# Patient Record
Sex: Female | Born: 1980 | Race: Black or African American | Hispanic: No | Marital: Single | Smoking: Current every day smoker
Health system: Southern US, Community
[De-identification: ages and names within clinical notes are randomized; demographics above are authoritative.]

## PROBLEM LIST (undated history)

## (undated) DIAGNOSIS — J209 Acute bronchitis, unspecified: Secondary | ICD-10-CM

## (undated) DIAGNOSIS — M549 Dorsalgia, unspecified: Secondary | ICD-10-CM

## (undated) HISTORY — DX: Acute bronchitis, unspecified: J20.9

## (undated) HISTORY — PX: TUBAL LIGATION: SHX77

---

## 2018-10-25 DIAGNOSIS — Z0389 Encounter for observation for other suspected diseases and conditions ruled out: Secondary | ICD-10-CM | POA: Diagnosis not present

## 2018-10-25 DIAGNOSIS — Z3009 Encounter for other general counseling and advice on contraception: Secondary | ICD-10-CM | POA: Diagnosis not present

## 2018-10-25 DIAGNOSIS — Z1388 Encounter for screening for disorder due to exposure to contaminants: Secondary | ICD-10-CM | POA: Diagnosis not present

## 2019-05-17 ENCOUNTER — Other Ambulatory Visit: Payer: Self-pay

## 2019-05-17 ENCOUNTER — Encounter (HOSPITAL_COMMUNITY): Payer: Self-pay

## 2019-05-17 ENCOUNTER — Emergency Department (HOSPITAL_COMMUNITY)
Admission: EM | Admit: 2019-05-17 | Discharge: 2019-05-17 | Disposition: A | Discharge status: Home / Self Care | Payer: Medicaid Other | Attending: Emergency Medicine | Admitting: Emergency Medicine

## 2019-05-17 ENCOUNTER — Emergency Department (HOSPITAL_COMMUNITY): Payer: Medicaid Other

## 2019-05-17 DIAGNOSIS — F17210 Nicotine dependence, cigarettes, uncomplicated: Secondary | ICD-10-CM | POA: Insufficient documentation

## 2019-05-17 DIAGNOSIS — R0789 Other chest pain: Secondary | ICD-10-CM | POA: Diagnosis not present

## 2019-05-17 DIAGNOSIS — Z79899 Other long term (current) drug therapy: Secondary | ICD-10-CM | POA: Diagnosis not present

## 2019-05-17 DIAGNOSIS — F439 Reaction to severe stress, unspecified: Secondary | ICD-10-CM

## 2019-05-17 DIAGNOSIS — R519 Headache, unspecified: Secondary | ICD-10-CM | POA: Diagnosis not present

## 2019-05-17 DIAGNOSIS — R079 Chest pain, unspecified: Secondary | ICD-10-CM

## 2019-05-17 HISTORY — DX: Dorsalgia, unspecified: M54.9

## 2019-05-17 LAB — TROPONIN I (HIGH SENSITIVITY)
Troponin I (High Sensitivity): 3 ng/L (ref <18)
Troponin I (High Sensitivity): 4 ng/L (ref <18)

## 2019-05-17 LAB — CBC
HCT: 43.7 % (ref 36.0–46.0)
Hemoglobin: 14 g/dL (ref 12.0–15.0)
MCH: 30.1 pg (ref 26.0–34.0)
MCHC: 32 g/dL (ref 30.0–36.0)
MCV: 94 fL (ref 80.0–100.0)
Platelets: 401 K/uL — ABNORMAL HIGH (ref 150–400)
RBC: 4.65 MIL/uL (ref 3.87–5.11)
RDW: 13.7 % (ref 11.5–15.5)
WBC: 6.4 K/uL (ref 4.0–10.5)
nRBC: 0 % (ref 0.0–0.2)

## 2019-05-17 LAB — BASIC METABOLIC PANEL WITH GFR
Anion gap: 9 (ref 5–15)
BUN: 11 mg/dL (ref 6–20)
CO2: 22 mmol/L (ref 22–32)
Calcium: 9 mg/dL (ref 8.9–10.3)
Chloride: 105 mmol/L (ref 98–111)
Creatinine, Ser: 0.72 mg/dL (ref 0.44–1.00)
GFR calc Af Amer: >60 mL/min
GFR calc non Af Amer: >60 mL/min
Glucose, Bld: 94 mg/dL (ref 70–99)
Potassium: 3.6 mmol/L (ref 3.5–5.1)
Sodium: 136 mmol/L (ref 135–145)

## 2019-05-17 LAB — I-STAT BETA HCG BLOOD, ED (MC, WL, AP ONLY): I-stat hCG, quantitative: <5 m[IU]/mL

## 2019-05-17 LAB — HIV ANTIBODY (ROUTINE TESTING W REFLEX): HIV Screen 4th Generation wRfx: NONREACTIVE

## 2019-05-17 MED ORDER — ALPRAZOLAM 0.5 MG PO TABS: 0.5000 mg | ORAL_TABLET | Freq: Two times a day (BID) | ORAL | 0 refills | Status: DC | PRN

## 2019-05-17 MED ORDER — ALPRAZOLAM 0.5 MG PO TABS
0.5000 mg | ORAL_TABLET | Freq: Once | ORAL | Status: AC
  Administered 2019-05-17: 0.5 mg via ORAL
  Filled 2019-05-17: qty 1

## 2019-05-17 MED ORDER — SODIUM CHLORIDE 0.9% FLUSH: 3.0000 mL | Freq: Once | INTRAVENOUS | Status: DC
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
## 2019-05-17 NOTE — Discharge Instructions (Addendum)
Small prescription for anxiety medication.  Try to follow-up with [facility].  Phone number given.
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
## 2019-05-23 NOTE — Progress Notes (Signed)
Patient ID: Christina Rosales, female   DOB: 1980-05-19, 39 y.o.   MRN: [id number] Virtual Visit via Telephone Note  I connected with Barron Alvine on 05/24/19 at  3:30 PM EST by telephone and verified that I am speaking with the correct person using two identifiers.   I discussed the limitations, risks, security and privacy concerns of performing an evaluation and management service by telephone and the availability of in person appointments. I also discussed with the patient that there may be a patient responsible charge related to this service. The patient expressed understanding and agreed to proceed.  PATIENT visit by telephone virtually in the context of Covid-19 pandemic. Patient location:  home My Location:  [facility] office Persons on the call:  Me and the patient  History of Present Illness: After being seen in the ED 05/17/2019 for chest pain.  Work up unremarkable.  Prescribed xanax for anxiety.  Labs unremarkable including-BMP, CBC, HIV, cardiac enzymes, neg pregnancy.  Patient is feeling better and has not had anymore chest pain.  She does feel like she has problems with over thinking and worrying too much.  She took a medication about 10 years ago but is unable to remember the name of it.  She denies SI/HI.  Started a new job yesterday.  Depression screen PHQ 2/9 05/24/2019  Decreased Interest 1  Down, Depressed, Hopeless 1  PHQ - 2 Score 2  Altered sleeping 2  Tired, decreased energy 3  Change in appetite 1  Feeling bad or failure about yourself  1  Trouble concentrating 0  Moving slowly or fidgety/restless 0  Suicidal thoughts 0  PHQ-9 Score 9      Observations/Objective:  NAD.  A&Ox3   Assessment and Plan: 1. Anxiety Try zoloft 50mg  1/2 daily for 1 week then one daily - Ambulatory referral to Social Work  2. Chest pain, unspecified type resolved  3. Encounter for examination following treatment at hospital   Follow Up Instructions: Assign PCP IN 1 MONTH   I  discussed the assessment and treatment plan with the patient. The patient was provided an opportunity to ask questions and all were answered. The patient agreed with the plan and demonstrated an understanding of the instructions.   The patient was advised to call back or seek an in-person evaluation if the symptoms worsen or if the condition fails to improve as anticipated.  I provided 15 minutes of non-face-to-face time during this encounter.   , PA-C

## 2019-05-24 ENCOUNTER — Other Ambulatory Visit: Payer: Self-pay

## 2019-05-24 ENCOUNTER — Ambulatory Visit: Payer: Medicaid Other | Attending: Family Medicine | Admitting: Physician Assistant

## 2019-05-24 DIAGNOSIS — F419 Anxiety disorder, unspecified: Secondary | ICD-10-CM | POA: Diagnosis not present

## 2019-05-24 DIAGNOSIS — R079 Chest pain, unspecified: Secondary | ICD-10-CM

## 2019-05-24 DIAGNOSIS — Z09 Encounter for follow-up examination after completed treatment for conditions other than malignant neoplasm: Secondary | ICD-10-CM | POA: Diagnosis not present

## 2019-05-24 MED ORDER — SERTRALINE HCL 50 MG PO TABS: 50.0000 mg | ORAL_TABLET | Freq: Every day | ORAL | 3 refills | Status: DC

## 2019-05-24 NOTE — Progress Notes (Signed)
Hospital f /u Feeling lightheaded   Would like resources to smoking cessation

## 2019-05-29 ENCOUNTER — Telehealth: Payer: Self-pay | Admitting: Licensed Clinical Social Worker

## 2019-05-29 NOTE — Telephone Encounter (Signed)
Call placed to patient regarding [facility] referral. LCSW left message requesting a return call.  

## 2019-06-05 ENCOUNTER — Telehealth: Payer: Self-pay | Admitting: Licensed Clinical Social Worker

## 2019-06-05 NOTE — Telephone Encounter (Signed)
Call placed to patient regarding [facility] referral. LCSW left message requesting a return call.  

## 2019-06-07 DIAGNOSIS — M7751 Other enthesopathy of right foot: Secondary | ICD-10-CM | POA: Diagnosis not present

## 2019-06-07 DIAGNOSIS — M7752 Other enthesopathy of left foot: Secondary | ICD-10-CM | POA: Diagnosis not present

## 2019-06-07 DIAGNOSIS — M722 Plantar fascial fibromatosis: Secondary | ICD-10-CM | POA: Diagnosis not present

## 2019-06-11 DIAGNOSIS — H5213 Myopia, bilateral: Secondary | ICD-10-CM | POA: Diagnosis not present

## 2019-06-28 DIAGNOSIS — M722 Plantar fascial fibromatosis: Secondary | ICD-10-CM | POA: Diagnosis not present

## 2019-07-02 ENCOUNTER — Encounter (HOSPITAL_COMMUNITY): Payer: Self-pay

## 2019-07-02 ENCOUNTER — Ambulatory Visit (HOSPITAL_COMMUNITY)
Admission: EM | Admit: 2019-07-02 | Discharge: 2019-07-02 | Disposition: A | Discharge status: Home / Self Care | Payer: Medicaid Other | Attending: Family Medicine | Admitting: Family Medicine

## 2019-07-02 ENCOUNTER — Other Ambulatory Visit: Payer: Self-pay

## 2019-07-02 DIAGNOSIS — J029 Acute pharyngitis, unspecified: Secondary | ICD-10-CM | POA: Diagnosis not present

## 2019-07-02 LAB — POCT RAPID STREP A: Streptococcus, Group A Screen (Direct): NEGATIVE

## 2019-07-02 MED ORDER — FLUTICASONE PROPIONATE 50 MCG/ACT NA SUSP: 2.0000 | Freq: Every day | NASAL | 2 refills | Status: DC

## 2019-07-02 MED ORDER — AMOXICILLIN 875 MG PO TABS: 875.0000 mg | ORAL_TABLET | Freq: Two times a day (BID) | ORAL | 0 refills | Status: DC

## 2019-07-02 NOTE — Discharge Instructions (Signed)
Drink plenty of fluids Take amoxicillin 2 times a day for a week Use the Flonase until your symptoms improve  consider taking medicine for stomach acid like omeprazole, elevate the head of your bed if you think acid reflux could be contributing See your family doctor in follow-up

## 2019-07-02 NOTE — ED Triage Notes (Signed)
Patient complaining of sore throat for about two weeks now, but has gotten much worse over the last two days. Denies fever, cough, body aches, headache, nasal congestion, and is tested twice weekly through her employer. Most recent covid test was today and was negative.

## 2019-07-02 NOTE — ED Provider Notes (Signed)
[place]    CSN: [id number] Arrival date & time: 07/02/19  1900      History   Chief Complaint Chief Complaint  Patient presents with  . Sore Throat    HPI Christina Rosales is a 39 y.o. female.   HPI  Patient has had a sore throat for 2 weeks.  Is been worse the last 2 days.  No fever chills.  No body aches.  No runny or stuffy nose or sinus pain although she does have chronic postnasal drip.  She denies any GI distress or acid reflux.  She states she very rarely gets heartburn.  She does have some allergy symptoms.  They are never severe.  She is not taking any medications.  She has tried hot teas, sore throat sprays, salt water gargles.  Her throat is still painful.  No known exposure to illness.  She gets Covid tested twice a week.  She works in a nursing home.  She does smoke cigarettes and is advised to quit  Past Medical History:  Diagnosis Date  . Back pain     Patient Active Problem List   Diagnosis Date Noted  . Anxiety 05/24/2019    Past Surgical History:  Procedure Laterality Date  . TUBAL LIGATION      OB History   No obstetric history on file.      Home Medications    Prior to Admission medications   Medication Sig Start Date End Date Taking? Authorizing Provider  ALPRAZolam Duanne Moron) 0.5 MG tablet Take 1 tablet (0.5 mg total) by mouth 2 (two) times daily as needed for anxiety. 05/17/19   Nat Christen, MD  amoxicillin (AMOXIL) 875 MG tablet Take 1 tablet (875 mg total) by mouth 2 (two) times daily. 07/02/19   Raylene Everts, MD  Aspirin-Salicylamide-Caffeine [facility] HEADACHE PO) Take 1 packet by mouth daily as needed (pain).    [provider]  fluticasone (FLONASE) 50 MCG/ACT nasal spray Place 2 sprays into both nostrils daily. 07/02/19   Raylene Everts, MD  Multiple Vitamin (MULTIVITAMIN ADULT) TABS Take 1 tablet by mouth daily.    [provider]  naproxen sodium (ALEVE) 220 MG tablet Take 220 mg by mouth daily as needed  (pain).    [provider]  sertraline (ZOLOFT) 50 MG tablet Take 1 tablet (50 mg total) by mouth daily. 05/24/19   Argentina Donovan, PA-C    Family History No family history on file.  Social History Social History   Tobacco Use  . Smoking status: Current Some Day Smoker    Packs/day: 0.25  . Smokeless tobacco: Never Used  Substance Use Topics  . Alcohol use: Yes    Comment: socially   . Drug use: Yes    Types: Marijuana     Allergies   Kiwi extract and Percocet [oxycodone-acetaminophen]   Review of Systems Review of Systems  Constitutional: Negative for chills and fever.  HENT: Positive for postnasal drip and sore throat. Negative for congestion.   Gastrointestinal: Negative for abdominal pain.  Neurological: Negative for headaches.     Physical Exam Triage Vital Signs ED Triage Vitals  Enc Vitals Group     BP 07/02/19 1917 112/64     Pulse Rate 07/02/19 1917 72     Resp 07/02/19 1917 14     Temp 07/02/19 1917 98.5 F (36.9 C)     Temp Source 07/02/19 1917 Oral     SpO2 07/02/19 1917 97 %  Weight 07/02/19 1919 195 lb (88.5 kg)     Height --      Head Circumference --      Peak Flow --      Pain Score 07/02/19 1919 6     Pain Loc --      Pain Edu? --      Excl. in GC? --    No data found.  Updated Vital Signs BP 112/64 (BP Location: Left Arm)   Pulse 72   Temp 98.5 F (36.9 C) (Oral)   Resp 14   Wt 88.5 kg   SpO2 97%      Physical Exam Constitutional:      General: She is not in acute distress.    Appearance: She is well-developed.  HENT:     Head: Normocephalic and atraumatic.     Right Ear: Tympanic membrane and ear canal normal.     Left Ear: Tympanic membrane and ear canal normal.     Nose: No congestion.     Mouth/Throat:     Mouth: Mucous membranes are moist.     Pharynx: Posterior oropharyngeal erythema present.     Tonsils: No tonsillar exudate or tonsillar abscesses. 2+ on the right. 2+ on the left.  Eyes:      Conjunctiva/sclera: Conjunctivae normal.     Pupils: Pupils are equal, round, and reactive to light.  Cardiovascular:     Rate and Rhythm: Normal rate and regular rhythm.  Pulmonary:     Effort: Pulmonary effort is normal. No respiratory distress.     Breath sounds: Normal breath sounds.  Musculoskeletal:        General: Normal range of motion.     Cervical back: Normal range of motion.  Lymphadenopathy:     Cervical: No cervical adenopathy.  Skin:    General: Skin is warm and dry.  Neurological:     Mental Status: She is alert.  Psychiatric:        Mood and Affect: Mood normal.        Behavior: Behavior normal.      UC Treatments / Results  Labs (all labs ordered are listed, but only abnormal results are displayed) Labs Reviewed  CULTURE, GROUP A STREP [facility])  POCT RAPID STREP A    EKG   Radiology No results found.  Procedures Procedures (including critical care time)  Medications Ordered in UC Medications - No data to display  Initial Impression / Assessment and Plan / UC Course  I have reviewed the triage vital signs and the nursing notes.  Pertinent labs & imaging results that were available during my care of the patient were reviewed by me and considered in my medical decision making (see chart for details).      The rapid strep test is negative.  Culture is pending.  We discussed that her chronic sore throat can be caused by exogenous chemicals and fumes such as her cigarette smoking, postnasal drip and sinus problems or allergies, it can be a primary throat infection or tonsillitis although it is unusual to last 2 weeks, it can be from acid reflux especially at nighttime.  Going to start with treatment of sinusitis with Flonase and amoxicillin since she states she does have some postnasal drip.  She will pay attention to her GI symptoms and consider omeprazole if she fails to improve.  Follow-up with her PCP.  Consider ENT consultation if fails to  improve Final Clinical Impressions(s) / UC Diagnoses   Final diagnoses:  Pharyngitis, unspecified etiology     Discharge Instructions     Drink plenty of fluids Take amoxicillin 2 times a day for a week Use the Flonase until your symptoms improve  consider taking medicine for stomach acid like omeprazole, elevate the head of your bed if you think acid reflux could be contributing See your family doctor in follow-up    ED Prescriptions    Medication Sig Dispense Auth. Provider   fluticasone (FLONASE) 50 MCG/ACT nasal spray Place 2 sprays into both nostrils daily. 16 g Eustace Moore, MD   amoxicillin (AMOXIL) 875 MG tablet Take 1 tablet (875 mg total) by mouth 2 (two) times daily. 14 tablet Eustace Moore, MD     PDMP not reviewed this encounter.   Eustace Moore, MD 07/02/19 2008

## 2019-07-03 ENCOUNTER — Telehealth (HOSPITAL_COMMUNITY): Payer: Self-pay

## 2019-07-03 MED ORDER — FLUCONAZOLE 150 MG PO TABS: 150.0000 mg | ORAL_TABLET | Freq: Once | ORAL | 0 refills | Status: AC

## 2019-07-03 NOTE — Telephone Encounter (Signed)
Pt phoned and requested a Rx of Diflucan while she takes Amoxicillin that was prescribed yesterday. Rx of Diflucan approved by Patterson Hammersmith, PA and sent to pt's pharmacy requested, [facility] on [address].

## 2019-07-05 LAB — CULTURE, GROUP A STREP (THRC)

## 2019-10-19 ENCOUNTER — Other Ambulatory Visit: Payer: Self-pay | Admitting: Physician Assistant

## 2020-01-03 DIAGNOSIS — Z6838 Body mass index (BMI) 38.0-38.9, adult: Secondary | ICD-10-CM | POA: Diagnosis not present

## 2020-01-03 DIAGNOSIS — E669 Obesity, unspecified: Secondary | ICD-10-CM | POA: Diagnosis not present

## 2020-01-03 DIAGNOSIS — Z23 Encounter for immunization: Secondary | ICD-10-CM | POA: Diagnosis not present

## 2020-01-03 DIAGNOSIS — Z01411 Encounter for gynecological examination (general) (routine) with abnormal findings: Secondary | ICD-10-CM | POA: Diagnosis not present

## 2020-01-03 DIAGNOSIS — Z131 Encounter for screening for diabetes mellitus: Secondary | ICD-10-CM | POA: Diagnosis not present

## 2020-01-03 DIAGNOSIS — Z113 Encounter for screening for infections with a predominantly sexual mode of transmission: Secondary | ICD-10-CM | POA: Diagnosis not present

## 2020-01-03 DIAGNOSIS — Z13220 Encounter for screening for lipoid disorders: Secondary | ICD-10-CM | POA: Diagnosis not present

## 2020-01-03 DIAGNOSIS — Z30011 Encounter for initial prescription of contraceptive pills: Secondary | ICD-10-CM | POA: Diagnosis not present

## 2020-01-03 DIAGNOSIS — Z3009 Encounter for other general counseling and advice on contraception: Secondary | ICD-10-CM | POA: Diagnosis not present

## 2020-01-03 DIAGNOSIS — Z124 Encounter for screening for malignant neoplasm of cervix: Secondary | ICD-10-CM | POA: Diagnosis not present

## 2020-01-03 DIAGNOSIS — Z114 Encounter for screening for human immunodeficiency virus [HIV]: Secondary | ICD-10-CM | POA: Diagnosis not present

## 2020-01-10 DIAGNOSIS — Z30014 Encounter for initial prescription of intrauterine contraceptive device: Secondary | ICD-10-CM | POA: Diagnosis not present

## 2020-01-10 DIAGNOSIS — Z30430 Encounter for insertion of intrauterine contraceptive device: Secondary | ICD-10-CM | POA: Diagnosis not present

## 2020-01-10 DIAGNOSIS — N920 Excessive and frequent menstruation with regular cycle: Secondary | ICD-10-CM | POA: Diagnosis not present

## 2020-01-11 DIAGNOSIS — Z23 Encounter for immunization: Secondary | ICD-10-CM | POA: Diagnosis not present

## 2020-01-29 DIAGNOSIS — L700 Acne vulgaris: Secondary | ICD-10-CM | POA: Diagnosis not present

## 2020-01-29 DIAGNOSIS — R635 Abnormal weight gain: Secondary | ICD-10-CM | POA: Diagnosis not present

## 2020-01-29 DIAGNOSIS — N898 Other specified noninflammatory disorders of vagina: Secondary | ICD-10-CM | POA: Diagnosis not present

## 2020-01-29 DIAGNOSIS — N760 Acute vaginitis: Secondary | ICD-10-CM | POA: Diagnosis not present

## 2020-01-29 DIAGNOSIS — B9689 Other specified bacterial agents as the cause of diseases classified elsewhere: Secondary | ICD-10-CM | POA: Diagnosis not present

## 2020-03-11 ENCOUNTER — Emergency Department (HOSPITAL_COMMUNITY): Payer: Medicaid Other

## 2020-03-11 ENCOUNTER — Emergency Department (HOSPITAL_COMMUNITY)
Admission: EM | Admit: 2020-03-11 | Discharge: 2020-03-11 | Disposition: A | Discharge status: Home / Self Care | Payer: Medicaid Other | Attending: Emergency Medicine | Admitting: Emergency Medicine

## 2020-03-11 ENCOUNTER — Other Ambulatory Visit: Payer: Self-pay

## 2020-03-11 ENCOUNTER — Encounter (HOSPITAL_COMMUNITY): Payer: Self-pay

## 2020-03-11 DIAGNOSIS — R110 Nausea: Secondary | ICD-10-CM | POA: Diagnosis not present

## 2020-03-11 DIAGNOSIS — F17200 Nicotine dependence, unspecified, uncomplicated: Secondary | ICD-10-CM | POA: Diagnosis not present

## 2020-03-11 DIAGNOSIS — R0789 Other chest pain: Secondary | ICD-10-CM | POA: Diagnosis not present

## 2020-03-11 DIAGNOSIS — R079 Chest pain, unspecified: Secondary | ICD-10-CM | POA: Diagnosis not present

## 2020-03-11 DIAGNOSIS — R0602 Shortness of breath: Secondary | ICD-10-CM | POA: Diagnosis not present

## 2020-03-11 LAB — TROPONIN I (HIGH SENSITIVITY)
Troponin I (High Sensitivity): 3 ng/L (ref <18)
Troponin I (High Sensitivity): 3 ng/L (ref <18)

## 2020-03-11 LAB — CBC
HCT: 36.9 % (ref 36.0–46.0)
Hemoglobin: 11.8 g/dL — ABNORMAL LOW (ref 12.0–15.0)
MCH: 29.9 pg (ref 26.0–34.0)
MCHC: 32 g/dL (ref 30.0–36.0)
MCV: 93.4 fL (ref 80.0–100.0)
Platelets: 351 10*3/uL (ref 150–400)
RBC: 3.95 MIL/uL (ref 3.87–5.11)
RDW: 13.6 % (ref 11.5–15.5)
WBC: 9.3 10*3/uL (ref 4.0–10.5)
nRBC: 0 % (ref 0.0–0.2)

## 2020-03-11 LAB — BASIC METABOLIC PANEL
Anion gap: 10 (ref 5–15)
BUN: 13 mg/dL (ref 6–20)
CO2: 23 mmol/L (ref 22–32)
Calcium: 8.7 mg/dL — ABNORMAL LOW (ref 8.9–10.3)
Chloride: 105 mmol/L (ref 98–111)
Creatinine, Ser: 0.61 mg/dL (ref 0.44–1.00)
GFR, Estimated: >60 mL/min (ref >60)
Glucose, Bld: 108 mg/dL — ABNORMAL HIGH (ref 70–99)
Potassium: 3.4 mmol/L — ABNORMAL LOW (ref 3.5–5.1)
Sodium: 138 mmol/L (ref 135–145)

## 2020-03-11 LAB — PREGNANCY, URINE: Preg Test, Ur: NEGATIVE

## 2020-03-11 MED ORDER — SUCRALFATE 1 G PO TABS: 1.0000 g | ORAL_TABLET | Freq: Four times a day (QID) | ORAL | 0 refills | Status: DC | PRN

## 2020-03-11 MED ORDER — OMEPRAZOLE 20 MG PO CPDR: 20.0000 mg | DELAYED_RELEASE_CAPSULE | Freq: Every day | ORAL | 1 refills | Status: DC

## 2020-03-11 MED ORDER — ALUM & MAG HYDROXIDE-SIMETH 200-200-20 MG/5ML PO SUSP
30.0000 mL | Freq: Once | ORAL | Status: AC
  Administered 2020-03-11: 30 mL via ORAL
  Filled 2020-03-11: qty 30

## 2020-03-11 NOTE — ED Provider Notes (Signed)
[facility]-EMERGENCY DEPT [facility] Emergency Department Provider Note MRN:  [id number]  Arrival date & time: 03/11/20     Chief Complaint   Chest Pain   History of Present Illness   Christina Rosales is a 39 y.o. year-old female with no pertinent past medical history presenting to the ED with chief complaint of chest pain.  Location: Central chest, left lateral neck/throat Duration: 1 week Onset: Gradual Timing: Constant Description: Sharp Severity: Mild to moderate Exacerbating/Alleviating Factors: None, seems random Associated Symptoms: Nausea, mild shortness of breath Pertinent Negatives: Denies fever, no cough, no diaphoresis, no vomiting, no abdominal pain, no leg pain or swelling   Review of Systems  A complete 10 system review of systems was obtained and all systems are negative except as noted in the HPI and PMH.   Patient's Health History    Past Medical History:  Diagnosis Date  . Back pain     Past Surgical History:  Procedure Laterality Date  . TUBAL LIGATION      No family history on file.  Social History   Socioeconomic History  . Marital status: Single    Spouse name: Not on file  . Number of children: Not on file  . Years of education: Not on file  . Highest education level: Not on file  Occupational History  . Not on file  Tobacco Use  . Smoking status: Current Some Day Smoker    Packs/day: 0.25  . Smokeless tobacco: Never Used  Substance and Sexual Activity  . Alcohol use: Yes    Comment: socially   . Drug use: Yes    Types: Marijuana  . Sexual activity: Not Currently  Other Topics Concern  . Not on file  Social History Narrative  . Not on file   Social Determinants of Health   Financial Resource Strain:   . Difficulty of Paying Living Expenses: Not on file  Food Insecurity:   . Worried About Programme researcher, broadcasting/film/video in the Last Year: Not on file  . Ran Out of Food in the Last Year: Not on file  Transportation Needs:   . Lack of  Transportation (Medical): Not on file  . Lack of Transportation (Non-Medical): Not on file  Physical Activity:   . Days of Exercise per Week: Not on file  . Minutes of Exercise per Session: Not on file  Stress:   . Feeling of Stress : Not on file  Social Connections:   . Frequency of Communication with Friends and Family: Not on file  . Frequency of Social Gatherings with Friends and Family: Not on file  . Attends Religious Services: Not on file  . Active Member of Clubs or Organizations: Not on file  . Attends Banker Meetings: Not on file  . Marital Status: Not on file  Intimate Partner Violence:   . Fear of Current or Ex-Partner: Not on file  . Emotionally Abused: Not on file  . Physically Abused: Not on file  . Sexually Abused: Not on file     Physical Exam   Vitals:   03/11/20 0800 03/11/20 0830  BP: 125/87 (!) 156/110  Pulse:  61  Resp: 16 16  Temp:  98.6 F (37 C)  SpO2:  100%    CONSTITUTIONAL: Well-appearing, NAD NEURO:  Alert and oriented x 3, no focal deficits EYES:  eyes equal and reactive ENT/NECK:  no LAD, no JVD CARDIO: Regular rate, well-perfused, normal S1 and S2 PULM:  CTAB no wheezing or rhonchi  GI/GU:  normal bowel sounds, non-distended, non-tender MSK/SPINE:  No gross deformities, no edema SKIN:  no rash, atraumatic PSYCH:  Appropriate speech and behavior  *Additional and/or pertinent findings included in MDM below  Diagnostic and Interventional Summary    EKG Interpretation  Date/Time:  Tuesday March 11 2020 06:46:24 EST Ventricular Rate:  68 PR Interval:    QRS Duration: 92 QT Interval:  394 QTC Calculation: 419 R Axis:   58 Text Interpretation: Sinus rhythm 12 Lead; Mason-L[place]ar Confirmed by Kennis Carina [phone]) on 03/11/2020 7:20:44 AM      Labs Reviewed  BASIC METABOLIC PANEL - Abnormal; Notable for the following components:      Result Value   Potassium 3.4 (*)    Glucose, Bld 108 (*)    Calcium 8.7 (*)    All  other components within normal limits  CBC - Abnormal; Notable for the following components:   Hemoglobin 11.8 (*)    All other components within normal limits  PREGNANCY, URINE  TROPONIN I (HIGH SENSITIVITY)  TROPONIN I (HIGH SENSITIVITY)    DG Chest 2 View  Final Result      Medications  alum & mag hydroxide-simeth (MAALOX/MYLANTA) 200-200-20 MG/5ML suspension 30 mL (30 mLs Oral Given 03/11/20 0758)     Procedures  /  Critical Care Procedures  ED Course and Medical Decision Making  I have reviewed the triage vital signs, the nursing notes, and pertinent available records from the EMR.  Listed above are laboratory and imaging tests that I personally ordered, reviewed, and interpreted and then considered in my medical decision making (see below for details).  Atypical chest pain, history of GERD, favoring GI etiology.  PERC negative.  Smoke cigarettes, otherwise minimal cardiovascular risk factors.  EKG is reassuring, awaiting troponin, chest x-ray.     Work-up reassuring, appropriate for discharge.  Elmer Sow. Pilar Plate, MD [facility] Emergency Medicine [facility] [email or web address]  Final Clinical Impressions(s) / ED Diagnoses     ICD-10-CM   1. Chest pain, unspecified type  R07.9     ED Discharge Orders         Ordered    sucralfate (CARAFATE) 1 g tablet  4 times daily PRN        03/11/20 0845    omeprazole (PRILOSEC) 20 MG capsule  Daily        03/11/20 0845           Discharge Instructions Discussed with and Provided to Patient:     Discharge Instructions     You were evaluated in the Emergency Department and after careful evaluation, we did not find any emergent condition requiring admission or further testing in the hospital.  Your exam/testing today was overall reassuring.  Your symptoms seem to be due to acid reflux.  We did tests today in the emergency department that did not show any signs of heart attack or other emergencies.  Please  consider changing your diet per the instructions attached.  We recommend taking the omeprazole daily and the Carafate during the day as needed.  We recommend follow-up with a primary care doctor.  Please return to the Emergency Department if you experience any worsening of your condition.  Thank you for allowing [place] to be a part of your care.        Sabas Sous, MD 03/11/20 [id number]

## 2020-03-11 NOTE — Discharge Instructions (Signed)
You were evaluated in the Emergency Department and after careful evaluation, we did not find any emergent condition requiring admission or further testing in the hospital.  Your exam/testing today was overall reassuring.  Your symptoms seem to be due to acid reflux.  We did tests today in the emergency department that did not show any signs of heart attack or other emergencies.  Please consider changing your diet per the instructions attached.  We recommend taking the omeprazole daily and the Carafate during the day as needed.  We recommend follow-up with a primary care doctor.  Please return to the Emergency Department if you experience any worsening of your condition.  Thank you for allowing [place] to be a part of your care.

## 2020-03-11 NOTE — ED Triage Notes (Signed)
Patient arrived with complaints of left sided chest pain over the last week. States today she has felt short of breath and nauseated. Has been taking Prilosec and OTC pain medication with no relief.

## 2020-03-12 ENCOUNTER — Telehealth: Payer: Self-pay

## 2020-03-12 NOTE — Telephone Encounter (Signed)
Transition Care Management Follow-up Telephone Call  Date of discharge and from where: 03/11/2020 [facility]  How have you been since you were released from the hospital? Chest is still bothering her but not as bad. Was able to pick up rx sent to [facility] feels it did give her some relief.   Any questions or concerns? No  Items Reviewed:  Did the pt receive and understand the discharge instructions provided? Yes   Medications obtained and verified? Yes   Other? No   Any new allergies since your discharge? No   Dietary orders reviewed? Yes  Do you have support at home? Yes    Functional Questionnaire: (I = Independent and D = Dependent) ADLs: I  Bathing/Dressing- I  Meal Prep- I  Eating- I  Maintaining continence- I  Transferring/Ambulation- I  Managing Meds- I  Follow up appointments reviewed:   PCP Hospital f/u appt confirmed? No Patient stated she does have primary care but does not remember where, asked if maybe it was [facility], patient did not recall.   Are transportation arrangements needed? No   If their condition worsens, is the pt aware to call PCP or go to the Emergency Dept.? Yes  Was the patient provided with contact information for the PCP's office or ED? Yes  Was to pt encouraged to call back with questions or concerns? Yes

## 2020-05-21 ENCOUNTER — Emergency Department (HOSPITAL_COMMUNITY)
Admission: EM | Admit: 2020-05-21 | Discharge: 2020-05-21 | Disposition: A | Discharge status: Home / Self Care | Payer: No Typology Code available for payment source | Attending: Emergency Medicine | Admitting: Emergency Medicine

## 2020-05-21 ENCOUNTER — Encounter (HOSPITAL_COMMUNITY): Payer: Self-pay

## 2020-05-21 DIAGNOSIS — M25511 Pain in right shoulder: Secondary | ICD-10-CM | POA: Insufficient documentation

## 2020-05-21 DIAGNOSIS — M542 Cervicalgia: Secondary | ICD-10-CM | POA: Insufficient documentation

## 2020-05-21 DIAGNOSIS — R1013 Epigastric pain: Secondary | ICD-10-CM | POA: Insufficient documentation

## 2020-05-21 DIAGNOSIS — F17200 Nicotine dependence, unspecified, uncomplicated: Secondary | ICD-10-CM | POA: Diagnosis not present

## 2020-05-21 DIAGNOSIS — K219 Gastro-esophageal reflux disease without esophagitis: Secondary | ICD-10-CM | POA: Diagnosis not present

## 2020-05-21 MED ORDER — NAPROXEN 500 MG PO TABS: 500.0000 mg | ORAL_TABLET | Freq: Two times a day (BID) | ORAL | 0 refills | Status: DC

## 2020-05-21 MED ORDER — METHOCARBAMOL 500 MG PO TABS
500.0000 mg | ORAL_TABLET | Freq: Four times a day (QID) | ORAL | 0 refills | Status: DC
Start: 2020-05-21 — End: 2021-03-10

## 2020-05-21 MED ORDER — KETOROLAC TROMETHAMINE 60 MG/2ML IM SOLN
15.0000 mg | Freq: Once | INTRAMUSCULAR | Status: AC
  Administered 2020-05-21: 15 mg via INTRAMUSCULAR
  Filled 2020-05-21: qty 2

## 2020-05-21 NOTE — ED Triage Notes (Signed)
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

## 2020-05-21 NOTE — Discharge Instructions (Signed)
Please read and follow all provided instructions.  Your diagnoses today include:  1. Acute pain of right shoulder     Tests performed today include:  Vital signs. See below for your results today.   Medications prescribed:   Naproxen - anti-inflammatory pain medication  Do not exceed 500mg  naproxen every 12 hours, take with food  You have been prescribed an anti-inflammatory medication or NSAID. Take with food. Take smallest effective dose for the shortest duration needed for your pain. Stop taking if you experience stomach pain or vomiting.    Robaxin (methocarbamol) - muscle relaxer medication  DO NOT drive or perform any activities that require you to be awake and alert because this medicine can make you drowsy.   Take any prescribed medications only as directed.  Home care instructions:   Follow any educational materials contained in this packet  Follow R.I.C.E. Protocol:  R - rest your injury   I  - use ice on injury without applying directly to skin  C - compress injury with bandage or splint  E - elevate the injury as much as possible  Follow-up instructions: Please follow-up with your primary care provider or the provided orthopedic physician (bone specialist) if you continue to have significant pain in 1 week. In this case you may have a more severe injury that requires further care.   Return instructions:   Please return if your fingers are numb or tingling, appear gray or blue, or you have severe pain   Please return if you have worsening abdominal pain or develop vomiting or diarrhea.  Please return to the Emergency Department if you experience worsening symptoms.   Please return if you have any other emergent concerns.  Additional Information:  Your vital signs today were: BP (!) 178/111 (BP Location: Left Arm)   Pulse 81   Temp 97.8 F (36.6 C) (Oral)   Resp 18   Wt 112.5 kg   SpO2 100%   BMI 43.93 kg/m  If your blood pressure (BP) was  elevated above 135/85 this visit, please have this repeated by your doctor within one month. --------------

## 2020-05-21 NOTE — ED Provider Notes (Signed)
[facility]-EMERGENCY DEPT Provider Note   CSN: [id number] Arrival date & time: 05/21/20  0017     History No chief complaint on file.   Christina Rosales is a 40 y.o. female.  Patient presents the emergency department for evaluation of right-sided shoulder and neck pain.  Patient states that the pain started approximately 6 PM yesterday while she was at work.  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  She does a lot of heavy lifting..  The pain is intermittent and described as a spasm.  Patient applied ice and took Tylenol with gradual improvement this morning.  Pain is worse with palpation and movement of the right upper extremity.  No weakness, numbness, or tingling in the arms or legs.  Patient has a history of GERD and reports mild epigastric and left-sided abdominal pain that started around the same time.  She denies associated nausea, vomiting, diarrhea.  No chest pain or shortness of breath.  No fevers, chills, or cough.  No lower extremity symptoms and she is able to ambulate well.        Past Medical History:  Diagnosis Date  . Back pain     Patient Active Problem List   Diagnosis Date Noted  . Anxiety 05/24/2019    Past Surgical History:  Procedure Laterality Date  . TUBAL LIGATION       OB History   No obstetric history on file.     History reviewed. No pertinent family history.  Social History   Tobacco Use  . Smoking status: Current Some Day Smoker    Packs/day: 0.25  . Smokeless tobacco: Never Used  Substance Use Topics  . Alcohol use: Yes    Comment: socially   . Drug use: Yes    Types: Marijuana    Home Medications Prior to Admission medications   Medication Sig Start Date End Date Taking? Authorizing Provider  ALPRAZolam Prudy Feeler) 0.5 MG tablet Take 1 tablet (0.5 mg total) by mouth 2 (two) times daily as needed for anxiety. 05/17/19   Donnetta Hutching, MD  amoxicillin (AMOXIL) 875  MG tablet Take 1 tablet (875 mg total) by mouth 2 (two) times daily. 07/02/19   Eustace Moore, MD  Aspirin-Salicylamide-Caffeine [facility] HEADACHE PO) Take 1 packet by mouth daily as needed (pain).    [provider]  fluticasone (FLONASE) 50 MCG/ACT nasal spray Place 2 sprays into both nostrils daily. 07/02/19   Eustace Moore, MD  Multiple Vitamin (MULTIVITAMIN ADULT) TABS Take 1 tablet by mouth daily.    [provider]  omeprazole (PRILOSEC) 20 MG capsule Take 1 capsule (20 mg total) by mouth daily. 03/11/20   Sabas Sous, MD  sertraline (ZOLOFT) 50 MG tablet TAKE 1 TABLET(50 MG) BY MOUTH DAILY 10/19/19   Georgian Co M, PA-C  sucralfate (CARAFATE) 1 g tablet Take 1 tablet (1 g total) by mouth 4 (four) times daily as needed. 03/11/20   Sabas Sous, MD    Allergies    Kiwi extract and Percocet [oxycodone-acetaminophen]  Review of Systems   Review of Systems  Constitutional: Negative for fever.  HENT: Negative for rhinorrhea and sore throat.   Eyes: Negative for redness.  Respiratory: Negative for cough.   Cardiovascular: Negative for chest pain.  Gastrointestinal: Positive for abdominal pain. Negative for diarrhea, nausea and vomiting.  Genitourinary: Negative for dysuria, frequency, hematuria and urgency.  Musculoskeletal: Positive for myalgias and  neck pain. Negative for arthralgias.  Skin: Negative for rash.  Neurological: Negative for headaches.    Physical Exam Updated Vital Signs BP (!) 178/111 (BP Location: Left Arm)   Pulse 81   Temp 97.8 F (36.6 C) (Oral)   Resp 18   Wt 112.5 kg   SpO2 100%   BMI 43.93 kg/m   Physical Exam Vitals and nursing note reviewed.  Constitutional:      Appearance: She is well-developed and well-nourished.  HENT:     Head: Normocephalic and atraumatic.  Eyes:     Pupils: Pupils are equal, round, and reactive to light.  Cardiovascular:     Pulses: Normal pulses. No decreased pulses.  Abdominal:      Tenderness: There is abdominal tenderness. There is no guarding or rebound.     Comments: Mild left upper to left lateral abdominal tenderness reported to deep palpation.  No right upper quadrant tenderness.    Musculoskeletal:        General: Tenderness present. No edema.     Right shoulder: Tenderness present. No bony tenderness. Decreased range of motion.     Left shoulder: No tenderness or bony tenderness. Normal range of motion.     Right upper arm: No tenderness.     Left upper arm: No tenderness.     Right elbow: Normal range of motion. No tenderness.     Left elbow: Normal range of motion. No tenderness.     Cervical back: Normal range of motion and neck supple. Tenderness present. No bony tenderness.     Thoracic back: Normal range of motion.     Lumbar back: Normal range of motion.       Back:     Comments: Patient with tenderness to palpation over the right lateral neck muscles as well as over the superior and anterior shoulder.  Patient winces in pain when I palpate over this area and appears uncomfortable when asked to raise her arms above her head.  Skin:    General: Skin is warm and dry.  Neurological:     Mental Status: She is alert.     Sensory: No sensory deficit.     Comments: Motor, sensation, and vascular distal to the injury is fully intact.   Psychiatric:        Mood and Affect: Mood and affect normal.     ED Results / Procedures / Treatments   Labs (all labs ordered are listed, but only abnormal results are displayed) Labs Reviewed - No data to display  EKG None  Radiology No results found.  Procedures Procedures   Medications Ordered in ED Medications  ketorolac (TORADOL) injection 15 mg (15 mg Intramuscular Given 05/21/20 3790)    ED Course  I have reviewed the triage vital signs and the nursing notes.  Pertinent labs & imaging results that were available during my care of the patient were reviewed by me and considered in my medical decision  making (see chart for details).  Patient seen and examined. Medications ordered.   Vital signs reviewed and are as follows: BP (!) 178/111 (BP Location: Left Arm)   Pulse 81   Temp 97.8 F (36.6 C) (Oral)   Resp 18   Wt 112.5 kg   SpO2 100%   BMI 43.93 kg/m   In regards to shoulder pain, will treat as musculoskeletal given exam and history.  Will give dose of IM Toradol, NSAIDs and Robaxin for home.  Patient counseled on proper use of  muscle relaxant medication.  They were told not to drink alcohol, drive any vehicle, or do any dangerous activities while taking this medication.  Patient verbalized understanding.  In regards to abdominal pain, symptoms are mild.  Patient will monitor at home. The patient was urged to return to the Emergency Department immediately with worsening of current symptoms, worsening abdominal pain, persistent vomiting, blood noted in stools, fever, or any other concerns. The patient verbalized understanding.       MDM Rules/Calculators/A&P                          Patient with right shoulder and lateral neck pain in setting of a physically demanding occupation.  Seems very musculoskeletal in nature as it is worse with movement and palpation.  Patient has slightly decreased active range of motion due to pain.  Right upper extremity is neurovascularly intact with normal sensation to light touch in right upper extremity as well as 2+ radial pulse and capillary refill.  I do not suspect upper extremity DVT or arterial insufficiency at this time.  Patient has mild left-sided abdominal pain of uncertain etiology.  I do not feel that she needs worked up further for this at this time given that her pain is mild and she does not have associated symptoms.  I considered referred pain however patient does not have any right-sided abdominal pain to correlate with her current shoulder pain.  Symptoms are not concerning for ACS.   Final Clinical Impression(s) / ED  Diagnoses Final diagnoses:  Acute pain of right shoulder    Rx / DC Orders ED Discharge Orders         Ordered    naproxen (NAPROSYN) 500 MG tablet  2 times daily        05/21/20 0715    methocarbamol (ROBAXIN) 500 MG tablet  4 times daily        05/21/20 0715           Renne Crigler, PA-C 05/21/20 8101    Derwood Kaplan, MD 05/21/20 [id number]

## 2020-07-16 DIAGNOSIS — M7989 Other specified soft tissue disorders: Secondary | ICD-10-CM | POA: Diagnosis not present

## 2020-07-16 DIAGNOSIS — M722 Plantar fascial fibromatosis: Secondary | ICD-10-CM | POA: Diagnosis not present

## 2020-07-19 ENCOUNTER — Other Ambulatory Visit: Payer: Self-pay

## 2020-07-19 ENCOUNTER — Emergency Department (HOSPITAL_COMMUNITY)
Admission: EM | Admit: 2020-07-19 | Discharge: 2020-07-19 | Disposition: A | Discharge status: Home / Self Care | Payer: Medicaid Other | Attending: Emergency Medicine | Admitting: Emergency Medicine

## 2020-07-19 ENCOUNTER — Encounter (HOSPITAL_COMMUNITY): Payer: Self-pay | Admitting: Emergency Medicine

## 2020-07-19 DIAGNOSIS — H6992 Unspecified Eustachian tube disorder, left ear: Secondary | ICD-10-CM | POA: Insufficient documentation

## 2020-07-19 DIAGNOSIS — H6982 Other specified disorders of Eustachian tube, left ear: Secondary | ICD-10-CM

## 2020-07-19 DIAGNOSIS — R6884 Jaw pain: Secondary | ICD-10-CM | POA: Insufficient documentation

## 2020-07-19 DIAGNOSIS — F17210 Nicotine dependence, cigarettes, uncomplicated: Secondary | ICD-10-CM | POA: Insufficient documentation

## 2020-07-19 DIAGNOSIS — H9202 Otalgia, left ear: Secondary | ICD-10-CM | POA: Diagnosis present

## 2020-07-19 NOTE — ED Provider Notes (Signed)
[facility]-EMERGENCY DEPT Provider Note   CSN: [id number] Arrival date & time: 07/19/20  2101     History Chief Complaint  Patient presents with  . Jaw Pain  . Otalgia    Christina Rosales is a 40 y.o. female presenting for evaluation of left jaw and ear ache over the last month.  Pain is intermittent, worse when she lays on her left side.  She states she does have allergies stuffy nose and wonders if this relates.  No hearing loss, tinnitus, headache, fever.  She saw a dentist on Monday to see if it was a tooth that was causing her issue, and she had normal checkup.  Denies sore throat or other URI symptoms  The history is provided by the patient.       Past Medical History:  Diagnosis Date  . Back pain     Patient Active Problem List   Diagnosis Date Noted  . Anxiety 05/24/2019    Past Surgical History:  Procedure Laterality Date  . TUBAL LIGATION       OB History   No obstetric history on file.     History reviewed. No pertinent family history.  Social History   Tobacco Use  . Smoking status: Current Some Day Smoker    Packs/day: 0.25  . Smokeless tobacco: Never Used  Substance Use Topics  . Alcohol use: Yes    Comment: socially   . Drug use: Yes    Types: Marijuana    Home Medications Prior to Admission medications   Medication Sig Start Date End Date Taking? Authorizing Provider  ALPRAZolam Prudy Feeler) 0.5 MG tablet Take 1 tablet (0.5 mg total) by mouth 2 (two) times daily as needed for anxiety. 05/17/19   Donnetta Hutching, MD  amoxicillin (AMOXIL) 875 MG tablet Take 1 tablet (875 mg total) by mouth 2 (two) times daily. 07/02/19   Eustace Moore, MD  Aspirin-Salicylamide-Caffeine [facility] HEADACHE PO) Take 1 packet by mouth daily as needed (pain).    [provider]  fluticasone (FLONASE) 50 MCG/ACT nasal spray Place 2 sprays into both nostrils daily. 07/02/19   Eustace Moore, MD  methocarbamol (ROBAXIN) 500 MG tablet Take 1-2  tablets (500-1,000 mg total) by mouth 4 (four) times daily. 05/21/20   Renne Crigler, PA-C  Multiple Vitamin (MULTIVITAMIN ADULT) TABS Take 1 tablet by mouth daily.    [provider]  naproxen (NAPROSYN) 500 MG tablet Take 1 tablet (500 mg total) by mouth 2 (two) times daily. 05/21/20   Renne Crigler, PA-C  omeprazole (PRILOSEC) 20 MG capsule Take 1 capsule (20 mg total) by mouth daily. 03/11/20   Sabas Sous, MD  sertraline (ZOLOFT) 50 MG tablet TAKE 1 TABLET(50 MG) BY MOUTH DAILY 10/19/19   Georgian Co M, PA-C  sucralfate (CARAFATE) 1 g tablet Take 1 tablet (1 g total) by mouth 4 (four) times daily as needed. 03/11/20   Sabas Sous, MD    Allergies    Kiwi extract and Percocet [oxycodone-acetaminophen]  Review of Systems   Review of Systems  Constitutional: Negative for fever.  HENT: Positive for congestion and ear pain. Negative for dental problem and sore throat.     Physical Exam Updated Vital Signs BP (!) 144/87 (BP Location: Right Arm)   Pulse 85   Temp 98.6 F (37 C) (Oral)   Resp 18   Ht 5\' 3"  (1.6 m)   Wt 113.4 kg   SpO2 100%   BMI 44.29 kg/m  Physical Exam Vitals and nursing note reviewed.  Constitutional:      General: She is not in acute distress.    Appearance: She is well-developed.  HENT:     Head: Normocephalic and atraumatic.     Right Ear: Tympanic membrane, ear canal and external ear normal.     Left Ear: Tympanic membrane, ear canal and external ear normal.     Ears:     Comments: No swelling or erythema about the parotid gland region.  No mastoid tenderness or bogginess    Mouth/Throat:     Mouth: Mucous membranes are moist.     Pharynx: Oropharynx is clear.     Comments: Normal movement of the jaw without pain. Eyes:     Conjunctiva/sclera: Conjunctivae normal.  Cardiovascular:     Rate and Rhythm: Normal rate.  Pulmonary:     Effort: Pulmonary effort is normal.  Musculoskeletal:     Cervical back: Normal range of motion  and neck supple.  Lymphadenopathy:     Cervical: No cervical adenopathy.  Neurological:     Mental Status: She is alert.  Psychiatric:        Mood and Affect: Mood normal.        Behavior: Behavior normal.     ED Results / Procedures / Treatments   Labs (all labs ordered are listed, but only abnormal results are displayed) Labs Reviewed - No data to display  EKG None  Radiology No results found.  Procedures Procedures   Medications Ordered in ED Medications - No data to display  ED Course  I have reviewed the triage vital signs and the nursing notes.  Pertinent labs & imaging results that were available during my care of the patient were reviewed by me and considered in my medical decision making (see chart for details).    MDM Rules/Calculators/A&P                          Patient presenting for over 1 month of intermittent left ear and jaw pain.  Saw dentist on Monday and had normal checkup.  Does have seasonal allergies and wonder if this relates.  May very well be eustachian tube dysfunction.  She has normal ENT exam.  Is asymptomatic currently.  Recommend antihistamines, nasal sprays, PCP follow-up if symptoms do not resolve.  Discussed results, findings, treatment and follow up. Patient advised of return precautions. Patient verbalized understanding and agreed with plan.  Final Clinical Impression(s) / ED Diagnoses Final diagnoses:  Eustachian tube dysfunction, left    Rx / DC Orders ED Discharge Orders    None       Legan, [place] N, PA-C 07/19/20 2235    Wynetta Fines, MD 07/24/20 1025

## 2020-07-19 NOTE — ED Triage Notes (Addendum)
Pt reports R sided otalgia and bilateral jaw pain for about a month. Reports that she was seen at the dentist recently because she "thought she had a bad tooth," but the dentist cleared her. States that today pain worsened.

## 2020-07-19 NOTE — ED Triage Notes (Signed)
Emergency Medicine Provider Triage Evaluation Note  Christina Rosales , a 40 y.o. female  was evaluated in triage.  Pt complains of left ear and left jaw pain.  Patient had intermittent pain over the last month.  Patient reports her symptoms worsened today.  Patient reports that she saw a dentist on Monday because she "thought she had a bad tooth."  Patient reports that the dentist found no abscess or vomiting with her teeth.  Review of Systems  Positive: Otalgia, left jaw pain Negative: Fever, chills, hearing loss, otorrhea, trouble swallowing, change in voice  Physical Exam  BP (!) 144/87 (BP Location: Right Arm)   Pulse 85   Temp 98.6 F (37 C) (Oral)   Resp 18   Ht 5\' 3"  (1.6 m)   Wt 113.4 kg   SpO2 100%   BMI 44.29 kg/m  Gen:   Awake, no distress   HEENT:  Atraumatic  Resp:  Normal effort  Cardiac:  Normal rate  MSK:   Moves extremities without difficulty  Neuro:  Speech clear   Medical Decision Making  Medically screening exam initiated at 9:47 PM.  Appropriate orders placed.  Christina Rosales was informed that the remainder of the evaluation will be completed by another provider, this initial triage assessment does not replace that evaluation, and the importance of remaining in the ED until their evaluation is complete.  Clinical Impression   The patient appears stable so that the remainder of the work up may be completed by another provider.      Barron Alvine, Haskel Schroeder 07/19/20 2149

## 2020-07-19 NOTE — Discharge Instructions (Addendum)
Please follow closely with your primary care provider. Treat your symptoms with antihistamines and nasal sprays.

## 2020-11-07 DIAGNOSIS — M722 Plantar fascial fibromatosis: Secondary | ICD-10-CM | POA: Diagnosis not present

## 2020-11-20 ENCOUNTER — Emergency Department (HOSPITAL_COMMUNITY): Payer: Medicaid Other

## 2020-11-20 ENCOUNTER — Encounter (HOSPITAL_COMMUNITY): Payer: Self-pay | Admitting: Emergency Medicine

## 2020-11-20 ENCOUNTER — Emergency Department (HOSPITAL_COMMUNITY)
Admission: EM | Admit: 2020-11-20 | Discharge: 2020-11-20 | Disposition: A | Discharge status: Home / Self Care | Payer: Medicaid Other | Attending: Emergency Medicine | Admitting: Emergency Medicine

## 2020-11-20 ENCOUNTER — Other Ambulatory Visit: Payer: Self-pay

## 2020-11-20 DIAGNOSIS — Z20822 Contact with and (suspected) exposure to covid-19: Secondary | ICD-10-CM

## 2020-11-20 DIAGNOSIS — F17210 Nicotine dependence, cigarettes, uncomplicated: Secondary | ICD-10-CM | POA: Diagnosis not present

## 2020-11-20 DIAGNOSIS — U071 COVID-19: Secondary | ICD-10-CM | POA: Insufficient documentation

## 2020-11-20 DIAGNOSIS — R059 Cough, unspecified: Secondary | ICD-10-CM

## 2020-11-20 DIAGNOSIS — R0602 Shortness of breath: Secondary | ICD-10-CM | POA: Diagnosis not present

## 2020-11-20 MED ORDER — ALBUTEROL SULFATE HFA 108 (90 BASE) MCG/ACT IN AERS
4.0000 | INHALATION_SPRAY | Freq: Once | RESPIRATORY_TRACT | Status: AC
  Administered 2020-11-20: 4 via RESPIRATORY_TRACT
  Filled 2020-11-20: qty 6.7

## 2020-11-20 MED ORDER — PENICILLIN G BENZATHINE 1200000 UNIT/2ML IM SUSY: 1.2000 10*6.[IU] | PREFILLED_SYRINGE | Freq: Once | INTRAMUSCULAR | 0 refills | Status: DC

## 2020-11-20 MED ORDER — BENZONATATE 100 MG PO CAPS: 100.0000 mg | ORAL_CAPSULE | Freq: Three times a day (TID) | ORAL | 0 refills | Status: DC

## 2020-11-20 MED ORDER — PENICILLIN G BENZATHINE & PROC 1200000 UNIT/2ML IM SUSP: 1.2000 10*6.[IU] | Freq: Once | INTRAMUSCULAR | Status: DC

## 2020-11-20 MED ORDER — ALBUTEROL SULFATE HFA 108 (90 BASE) MCG/ACT IN AERS: 2.0000 | INHALATION_SPRAY | Freq: Four times a day (QID) | RESPIRATORY_TRACT | 0 refills | Status: DC | PRN

## 2020-11-20 MED ORDER — PENICILLIN G BENZATHINE 1200000 UNIT/2ML IM SUSY
1.2000 10*6.[IU] | PREFILLED_SYRINGE | Freq: Once | INTRAMUSCULAR | Status: AC
  Administered 2020-11-20: 1.2 10*6.[IU] via INTRAMUSCULAR

## 2020-11-20 NOTE — ED Triage Notes (Signed)
Patient states she began having cough, mild shortness of breath and fatigue with fevers yesterday. Patient states she began feeling bad Sunday, but symptoms improved and when she woke up today felt worse.

## 2020-11-20 NOTE — ED Provider Notes (Signed)
[facility]-EMERGENCY DEPT Provider Note   CSN: [id number] Arrival date & time: 11/20/20  1944     History Chief Complaint  Patient presents with   Cough   Fatigue    Christina Rosales is a 40 y.o. female with a past medical history significant for anxiety who presents to the ED due to cough, shortness of breath, and fatigue x3 days.  Patient states symptoms started with fatigue which gradually worsened over the past few days.  Patient denies associated chest pain and lower extremity edema.  No history of blood clots.  Patient's son has similar symptoms.  Patient is vaccinated against COVID-19 however, has not received her booster shot.  She also endorses intermittent fevers.  She has tried over-the-counter medication with moderate relief.  Admits to some abdominal pain which resolved after medication.  No nausea, vomiting, or diarrhea.  No aggravating or alleviating factors.  History obtained from patient and past medical records. No interpreter used during encounter.       Past Medical History:  Diagnosis Date   Back pain     Patient Active Problem List   Diagnosis Date Noted   Anxiety 05/24/2019    Past Surgical History:  Procedure Laterality Date   TUBAL LIGATION       OB History   No obstetric history on file.     No family history on file.  Social History   Tobacco Use   Smoking status: Some Days    Packs/day: 0.25    Types: Cigarettes   Smokeless tobacco: Never  Substance Use Topics   Alcohol use: Yes    Comment: socially    Drug use: Yes    Types: Marijuana    Home Medications Prior to Admission medications   Medication Sig Start Date End Date Taking? Authorizing Provider  albuterol (VENTOLIN HFA) 108 (90 Base) MCG/ACT inhaler Inhale 2 puffs into the lungs every 6 (six) hours as needed for wheezing or shortness of breath. 11/20/20  Yes Ethelwyn Gilbertson, Merla Riches, PA-C  benzonatate (TESSALON) 100 MG capsule Take 1 capsule (100 mg total)  by mouth every 8 (eight) hours. 11/20/20  Yes Raelan Burgoon, Merla Riches, PA-C  ALPRAZolam Prudy Feeler) 0.5 MG tablet Take 1 tablet (0.5 mg total) by mouth 2 (two) times daily as needed for anxiety. 05/17/19   Donnetta Hutching, MD  amoxicillin (AMOXIL) 875 MG tablet Take 1 tablet (875 mg total) by mouth 2 (two) times daily. 07/02/19   Eustace Moore, MD  Aspirin-Salicylamide-Caffeine [facility] HEADACHE PO) Take 1 packet by mouth daily as needed (pain).    [provider]  fluticasone (FLONASE) 50 MCG/ACT nasal spray Place 2 sprays into both nostrils daily. 07/02/19   Eustace Moore, MD  methocarbamol (ROBAXIN) 500 MG tablet Take 1-2 tablets (500-1,000 mg total) by mouth 4 (four) times daily. 05/21/20   Renne Crigler, PA-C  Multiple Vitamin (MULTIVITAMIN ADULT) TABS Take 1 tablet by mouth daily.    [provider]  naproxen (NAPROSYN) 500 MG tablet Take 1 tablet (500 mg total) by mouth 2 (two) times daily. 05/21/20   Renne Crigler, PA-C  omeprazole (PRILOSEC) 20 MG capsule Take 1 capsule (20 mg total) by mouth daily. 03/11/20   Sabas Sous, MD  sertraline (ZOLOFT) 50 MG tablet TAKE 1 TABLET(50 MG) BY MOUTH DAILY 10/19/19   Georgian Co M, PA-C  sucralfate (CARAFATE) 1 g tablet Take 1 tablet (1 g total) by mouth 4 (four) times daily as needed. 03/11/20   Kennis Carina  M, MD    Allergies    Kiwi extract and Percocet [oxycodone-acetaminophen]  Review of Systems   Review of Systems  Constitutional:  Positive for chills, fatigue and fever.  Respiratory:  Positive for cough and shortness of breath.   Cardiovascular:  Negative for chest pain and leg swelling.  Gastrointestinal:  Positive for abdominal pain (resolved). Negative for diarrhea, nausea and vomiting.  All other systems reviewed and are negative.  Physical Exam Updated Vital Signs BP (!) 142/87 (BP Location: Left Arm)   Pulse 89   Temp 99.6 F (37.6 C) (Oral)   Resp 18   SpO2 98%   Physical Exam Vitals and nursing note  reviewed.  Constitutional:      General: She is not in acute distress.    Appearance: She is not ill-appearing.  HENT:     Head: Normocephalic.  Eyes:     Pupils: Pupils are equal, round, and reactive to light.  Neck:     Comments: No meningismus. Cardiovascular:     Rate and Rhythm: Normal rate and regular rhythm.     Pulses: Normal pulses.     Heart sounds: Normal heart sounds. No murmur heard.   No friction rub. No gallop.  Pulmonary:     Effort: Pulmonary effort is normal.     Breath sounds: Rhonchi present.     Comments: Rhonchi present in left lung field.  Mild expiratory wheeze. Abdominal:     General: Abdomen is flat. There is no distension.     Palpations: Abdomen is soft.     Tenderness: There is no abdominal tenderness. There is no guarding or rebound.     Comments: Abdomen soft, nondistended, nontender to palpation in all quadrants without guarding or peritoneal signs. No rebound.   Musculoskeletal:        General: Normal range of motion.     Cervical back: Neck supple.  Skin:    General: Skin is warm and dry.  Neurological:     General: No focal deficit present.     Mental Status: She is alert.  Psychiatric:        Mood and Affect: Mood normal.        Behavior: Behavior normal.    ED Results / Procedures / Treatments   Labs (all labs ordered are listed, but only abnormal results are displayed) Labs Reviewed  RESP PANEL BY RT-PCR (FLU A&B, COVID) ARPGX2    EKG None  Radiology DG Chest Portable 1 View  Result Date: 11/20/2020 CLINICAL DATA:  Shortness of breath EXAM: PORTABLE CHEST 1 VIEW COMPARISON:  03/11/2020 FINDINGS: The heart size and mediastinal contours are within normal limits. Both lungs are clear. The visualized skeletal structures are unremarkable. IMPRESSION: No active disease. Electronically Signed   By: Alcide Clever M.D.   On: 11/20/2020 20:29    Procedures Procedures   Medications Ordered in ED Medications  albuterol (VENTOLIN HFA)  108 (90 Base) MCG/ACT inhaler 4 puff (4 puffs Inhalation Given 11/20/20 2014)    ED Course  I have reviewed the triage vital signs and the nursing notes.  Pertinent labs & imaging results that were available during my care of the patient were reviewed by me and considered in my medical decision making (see chart for details).    MDM Rules/Calculators/A&P                          40 year old female presents to the ED due to [facility]  symptoms for the past 3 days.  Patient is vaccinated gets COVID-19 however, has not received her booster shot.  Patient son has similar symptoms.  Patient endorses mild shortness of breath.  No associated chest pain or lower extremity edema.  No history of blood clots.  Upon arrival, stable vitals.  Patient is afebrile, not tachycardic or hypoxic.  Patient nontoxic-appearing.  Physical exam significant for rhonchi and expiratory wheeze.  Abdomen soft, nondistended, nontender.  No meningismus to suggest meningitis.  COVID/influenza test ordered.  Chest x-ray due to rhonchi.  Albuterol given.  Low suspicion for PE/DVT given no tachycardia or episodes of hypoxia.  Chest x-ray personally reviewed which is negative for signs pneumonia, pneumothorax, or widened mediastinum.  COVID/influenza test pending.  Patient requesting strep treatment due to positive strep test for her son.  Pen G given. Patient admits to improvement in shortness of breath after albuterol treatment.  Lungs clear to auscultation bilaterally.  Suspect symptoms related to viral etiology.  Quarantine guidelines discussed with patient.  Patient discharged with cough medication and albuterol. Strict ED precautions discussed with patient. Patient states understanding and agrees to plan. Patient discharged home in no acute distress and stable vitals  Sireen Halk was evaluated in Emergency Department on 11/20/2020 for the symptoms described in the history of present illness. She was evaluated in the context of  the global COVID-19 pandemic, which necessitated consideration that the patient might be at risk for infection with the SARS-CoV-2 virus that causes COVID-19. Institutional protocols and algorithms that pertain to the evaluation of patients at risk for COVID-19 are in a state of rapid change based on information released by regulatory bodies including the CDC and federal and state organizations. These policies and algorithms were followed during the patient's care in the ED.  Final Clinical Impression(s) / ED Diagnoses Final diagnoses:  Cough  Suspected COVID-19 virus infection    Rx / DC Orders ED Discharge Orders          Ordered    benzonatate (TESSALON) 100 MG capsule  Every 8 hours        11/20/20 2037    albuterol (VENTOLIN HFA) 108 (90 Base) MCG/ACT inhaler  Every 6 hours PRN        11/20/20 2037             Jesusita Oka 11/20/20 2117    Charlynne Pander, MD 11/21/20 1505

## 2020-11-20 NOTE — Discharge Instructions (Addendum)
It was a pleasure taking care of you today.  As discussed, your chest x-ray did not show any signs of pneumonia.  Your COVID and influenza test are pending.  I suspect your symptoms are related to a viral infection.  I am sending you home with cough medication and albuterol.  Use albuterol for shortness of breath.  Please follow-up with PCP if symptoms do not improve within the next week.  Return to the ER for new or worsening symptoms.  You were treated for strep throat since your son tested positive.

## 2020-11-21 ENCOUNTER — Telehealth: Payer: Self-pay

## 2020-11-21 LAB — RESP PANEL BY RT-PCR (FLU A&B, COVID) ARPGX2
Influenza A by PCR: NEGATIVE
Influenza B by PCR: NEGATIVE
SARS Coronavirus 2 by RT PCR: POSITIVE — AB

## 2020-11-21 NOTE — Telephone Encounter (Signed)
Transition Care Management Unsuccessful Follow-up Telephone Call  Date of discharge and from where:  11/20/2020-[facility]   Attempts:  1st Attempt  Reason for unsuccessful TCM follow-up call:  Unable to reach patient

## 2020-11-24 NOTE — Telephone Encounter (Signed)
Transition Care Management Unsuccessful Follow-up Telephone Call  Date of discharge and from where:  11/20/2020-[facility]   Attempts:  2nd Attempt  Reason for unsuccessful TCM follow-up call:  Unable to reach patient

## 2020-11-25 NOTE — Telephone Encounter (Signed)
Transition Care Management Follow-up Telephone Call Date of discharge and from where: 11/20/2020 from [facility] How have you been since you were released from the hospital? Pt stated that she is feeling a lot better but she still has a cough.  Any questions or concerns? No  Items Reviewed: Did the pt receive and understand the discharge instructions provided? Yes  Medications obtained and verified? Yes  Other? No  Any new allergies since your discharge? No  Dietary orders reviewed? No Do you have support at home? Yes   Functional Questionnaire: (I = Independent and D = Dependent) ADLs: I  Bathing/Dressing- I  Meal Prep- I  Eating- I  Maintaining continence- I  Transferring/Ambulation- I  Managing Meds- I   Follow up appointments reviewed:  PCP Hospital f/u appt confirmed? No   Specialist Hospital f/u appt confirmed? No   Are transportation arrangements needed? No  If their condition worsens, is the pt aware to call PCP or go to the Emergency Dept.? Yes Was the patient provided with contact information for the PCP's office or ED? Yes Was to pt encouraged to call back with questions or concerns? Yes

## 2021-03-09 ENCOUNTER — Encounter (HOSPITAL_COMMUNITY): Payer: Self-pay

## 2021-03-09 ENCOUNTER — Emergency Department (HOSPITAL_COMMUNITY): Payer: Medicaid Other

## 2021-03-09 ENCOUNTER — Other Ambulatory Visit: Payer: Self-pay

## 2021-03-09 ENCOUNTER — Emergency Department (HOSPITAL_COMMUNITY)
Admission: EM | Admit: 2021-03-09 | Discharge: 2021-03-10 | Disposition: A | Discharge status: Home / Self Care | Payer: Medicaid Other | Attending: Emergency Medicine | Admitting: Emergency Medicine

## 2021-03-09 DIAGNOSIS — Z8616 Personal history of COVID-19: Secondary | ICD-10-CM | POA: Insufficient documentation

## 2021-03-09 DIAGNOSIS — Z28310 Unvaccinated for covid-19: Secondary | ICD-10-CM | POA: Insufficient documentation

## 2021-03-09 DIAGNOSIS — R0603 Acute respiratory distress: Secondary | ICD-10-CM | POA: Insufficient documentation

## 2021-03-09 DIAGNOSIS — F17210 Nicotine dependence, cigarettes, uncomplicated: Secondary | ICD-10-CM | POA: Insufficient documentation

## 2021-03-09 DIAGNOSIS — J40 Bronchitis, not specified as acute or chronic: Secondary | ICD-10-CM

## 2021-03-09 DIAGNOSIS — R059 Cough, unspecified: Secondary | ICD-10-CM | POA: Insufficient documentation

## 2021-03-09 DIAGNOSIS — R600 Localized edema: Secondary | ICD-10-CM | POA: Diagnosis not present

## 2021-03-09 DIAGNOSIS — R509 Fever, unspecified: Secondary | ICD-10-CM | POA: Insufficient documentation

## 2021-03-09 DIAGNOSIS — Z20822 Contact with and (suspected) exposure to covid-19: Secondary | ICD-10-CM | POA: Diagnosis not present

## 2021-03-09 DIAGNOSIS — R0602 Shortness of breath: Secondary | ICD-10-CM | POA: Insufficient documentation

## 2021-03-09 LAB — CBC WITH DIFFERENTIAL/PLATELET
Abs Immature Granulocytes: 0.02 10*3/uL (ref 0.00–0.07)
Basophils Absolute: 0 10*3/uL (ref 0.0–0.1)
Basophils Relative: 0 %
Eosinophils Absolute: 0.2 10*3/uL (ref 0.0–0.5)
Eosinophils Relative: 2 %
HCT: 40.5 % (ref 36.0–46.0)
Hemoglobin: 12.9 g/dL (ref 12.0–15.0)
Immature Granulocytes: 0 %
Lymphocytes Relative: 18 %
Lymphs Abs: 1.6 10*3/uL (ref 0.7–4.0)
MCH: 29.3 pg (ref 26.0–34.0)
MCHC: 31.9 g/dL (ref 30.0–36.0)
MCV: 92 fL (ref 80.0–100.0)
Monocytes Absolute: 0.7 10*3/uL (ref 0.1–1.0)
Monocytes Relative: 7 %
Neutro Abs: 6.7 10*3/uL (ref 1.7–7.7)
Neutrophils Relative %: 73 %
Platelets: 379 10*3/uL (ref 150–400)
RBC: 4.4 MIL/uL (ref 3.87–5.11)
RDW: 14.1 % (ref 11.5–15.5)
WBC: 9.2 10*3/uL (ref 4.0–10.5)
nRBC: 0 % (ref 0.0–0.2)

## 2021-03-09 LAB — RESP PANEL BY RT-PCR (FLU A&B, COVID) ARPGX2
Influenza A by PCR: NEGATIVE
Influenza B by PCR: NEGATIVE
SARS Coronavirus 2 by RT PCR: NEGATIVE

## 2021-03-09 LAB — COMPREHENSIVE METABOLIC PANEL
ALT: 22 U/L (ref 0–44)
AST: 17 U/L (ref 15–41)
Albumin: 4.2 g/dL (ref 3.5–5.0)
Alkaline Phosphatase: 93 U/L (ref 38–126)
Anion gap: 6 (ref 5–15)
BUN: 9 mg/dL (ref 6–20)
CO2: 27 mmol/L (ref 22–32)
Calcium: 8.9 mg/dL (ref 8.9–10.3)
Chloride: 105 mmol/L (ref 98–111)
Creatinine, Ser: 0.8 mg/dL (ref 0.44–1.00)
GFR, Estimated: >60 mL/min (ref >60)
Glucose, Bld: 83 mg/dL (ref 70–99)
Potassium: 3.4 mmol/L — ABNORMAL LOW (ref 3.5–5.1)
Sodium: 138 mmol/L (ref 135–145)
Total Bilirubin: 0.7 mg/dL (ref 0.3–1.2)
Total Protein: 8.2 g/dL — ABNORMAL HIGH (ref 6.5–8.1)

## 2021-03-09 LAB — D-DIMER, QUANTITATIVE: D-Dimer, Quant: 0.47 ug/mL-FEU (ref 0.00–0.50)

## 2021-03-09 LAB — BLOOD GAS, VENOUS
Acid-Base Excess: 1.3 mmol/L (ref 0.0–2.0)
Bicarbonate: 27 mmol/L (ref 20.0–28.0)
O2 Saturation: 38.1 %
Patient temperature: 98.6
pCO2, Ven: 49.7 mmHg (ref 44.0–60.0)
pH, Ven: 7.354 (ref 7.250–7.430)
pO2, Ven: 25.2 mmHg — CL (ref 32.0–45.0)

## 2021-03-09 LAB — I-STAT CHEM 8, ED
BUN: 6 mg/dL (ref 6–20)
Calcium, Ion: 1.2 mmol/L (ref 1.15–1.40)
Chloride: 103 mmol/L (ref 98–111)
Creatinine, Ser: 0.7 mg/dL (ref 0.44–1.00)
Glucose, Bld: 79 mg/dL (ref 70–99)
HCT: 41 % (ref 36.0–46.0)
Hemoglobin: 13.9 g/dL (ref 12.0–15.0)
Potassium: 3.4 mmol/L — ABNORMAL LOW (ref 3.5–5.1)
Sodium: 141 mmol/L (ref 135–145)
TCO2: 26 mmol/L (ref 22–32)

## 2021-03-09 LAB — BRAIN NATRIURETIC PEPTIDE: B Natriuretic Peptide: 51.4 pg/mL (ref 0.0–100.0)

## 2021-03-09 LAB — TROPONIN I (HIGH SENSITIVITY): Troponin I (High Sensitivity): 5 ng/L (ref <18)

## 2021-03-09 LAB — LACTIC ACID, PLASMA: Lactic Acid, Venous: 1.3 mmol/L (ref 0.5–1.9)

## 2021-03-09 LAB — I-STAT BETA HCG BLOOD, ED (MC, WL, AP ONLY): I-stat hCG, quantitative: <5 m[IU]/mL (ref <5)

## 2021-03-09 MED ORDER — ALBUTEROL SULFATE (2.5 MG/3ML) 0.083% IN NEBU
5.0000 mg | INHALATION_SOLUTION | Freq: Once | RESPIRATORY_TRACT | Status: AC
  Administered 2021-03-09: 5 mg via RESPIRATORY_TRACT
  Filled 2021-03-09: qty 6

## 2021-03-09 MED ORDER — IPRATROPIUM BROMIDE 0.02 % IN SOLN: 0.5000 mg | Freq: Once | RESPIRATORY_TRACT | Status: DC

## 2021-03-09 MED ORDER — ALBUTEROL (5 MG/ML) CONTINUOUS INHALATION SOLN: 5.0000 mg/h | INHALATION_SOLUTION | Freq: Once | RESPIRATORY_TRACT | Status: DC

## 2021-03-09 NOTE — ED Provider Notes (Signed)
Emergency Medicine Provider Triage Evaluation Note  Christina Rosales , a 40 y.o. female  was evaluated in triage.  Pt complains of body aches, and shortness of breath.  The leg swelling started yesterday.  She has been drinking water.  No fevers.  No known sick contacts.  She has had worsening dyspnea with exertion.   Review of Systems  Positive: Nasal congestion, leg swelling congestion, short of breath,  Negative: syncope  Physical Exam  BP (!) 159/97   Pulse 94   Temp 98 F (36.7 C) (Oral)   Resp 18   SpO2 96%  Gen:   Awake, increased work of breathing, appears uncomfortable in mild distress Resp:  Diffuse rales bilaterally up to superior lung fields.  MSK:   Moves extremities without difficulty, 3+ pitting edema bilaterally Other:  Patient with increased work of breathing, unwell appearing.   Medical Decision Making  Medically screening exam initiated at 9:27 PM.  Appropriate orders placed.  Christina Rosales was informed that the remainder of the evaluation will be completed by another provider, this initial triage assessment does not replace that evaluation, and the importance of remaining in the ED until their evaluation is complete.  Note: Portions of this report may have been transcribed using voice recognition software. Every effort was made to ensure accuracy; however, inadvertent computerized transcription errors may be present      Christina Rosales 03/09/21 2146    Christina Pander, MD 03/09/21 2322

## 2021-03-09 NOTE — ED Triage Notes (Addendum)
Pt states she started taking mucinex yesterday for congestion. Pt c/o SOB. Pt states it worsens with exertion. Pt c/o body aches. Pt c/o bilateral leg swelling x3 days. Pt c/o back pain starting yesterday.

## 2021-03-10 MED ORDER — PREDNISONE 20 MG PO TABS: 40.0000 mg | ORAL_TABLET | Freq: Every day | ORAL | 0 refills | Status: DC

## 2021-03-10 MED ORDER — FUROSEMIDE 10 MG/ML IJ SOLN
20.0000 mg | Freq: Once | INTRAMUSCULAR | Status: AC
  Administered 2021-03-10: 20 mg via INTRAVENOUS
  Filled 2021-03-10: qty 4

## 2021-03-10 MED ORDER — ALBUTEROL SULFATE (2.5 MG/3ML) 0.083% IN NEBU
10.0000 mg/h | INHALATION_SOLUTION | Freq: Once | RESPIRATORY_TRACT | Status: AC
  Administered 2021-03-10: 10 mg/h via RESPIRATORY_TRACT
  Filled 2021-03-10: qty 12

## 2021-03-10 MED ORDER — POTASSIUM CHLORIDE CRYS ER 20 MEQ PO TBCR
40.0000 meq | EXTENDED_RELEASE_TABLET | Freq: Once | ORAL | Status: AC
  Administered 2021-03-10: 40 meq via ORAL
  Filled 2021-03-10: qty 2

## 2021-03-10 MED ORDER — ALBUTEROL SULFATE HFA 108 (90 BASE) MCG/ACT IN AERS: 2.0000 | INHALATION_SPRAY | RESPIRATORY_TRACT | 2 refills | Status: DC | PRN

## 2021-03-10 MED ORDER — ALBUTEROL SULFATE HFA 108 (90 BASE) MCG/ACT IN AERS
INHALATION_SPRAY | RESPIRATORY_TRACT | Status: AC
  Filled 2021-03-10: qty 6.7

## 2021-03-10 MED ORDER — DOXYCYCLINE HYCLATE 100 MG PO CAPS: 100.0000 mg | ORAL_CAPSULE | Freq: Two times a day (BID) | ORAL | 0 refills | Status: DC

## 2021-03-10 MED ORDER — IPRATROPIUM BROMIDE 0.02 % IN SOLN
0.5000 mg | Freq: Once | RESPIRATORY_TRACT | Status: AC
Start: 2021-03-10 — End: 2021-03-10
  Administered 2021-03-10: 0.5 mg via RESPIRATORY_TRACT
  Filled 2021-03-10: qty 2.5

## 2021-03-10 MED ORDER — METHYLPREDNISOLONE SODIUM SUCC 125 MG IJ SOLR
125.0000 mg | Freq: Once | INTRAMUSCULAR | Status: AC
  Administered 2021-03-10: 125 mg via INTRAVENOUS
  Filled 2021-03-10: qty 2

## 2021-03-10 MED ORDER — ALBUTEROL SULFATE HFA 108 (90 BASE) MCG/ACT IN AERS
2.0000 | INHALATION_SPRAY | RESPIRATORY_TRACT | Status: DC | PRN
  Administered 2021-03-10: 2 via RESPIRATORY_TRACT

## 2021-03-10 NOTE — ED Provider Notes (Signed)
[facility]-EMERGENCY DEPT Provider Note   CSN: [id number] Arrival date & time: 03/09/21  2026     History Chief Complaint  Patient presents with   Shortness of Breath   Generalized Body Aches   Leg Swelling    Christina Rosales is a 40 y.o. female.  HPI     40 year old female with no significant medical history comes in with chief complaint of shortness of breath, body aches and leg swelling along with subjective fevers  Patient states that she started feeling short of breath about 2 days ago.  The shortness of breath was pronounced mostly when she was exerting herself.  Last night she woke up in the middle night feeling short of breath.  She also started noticing swelling in her legs over the last 2 days.  She has no history of any cardiac disease or lung pathology.  Patient had COVID-19 back in August.  She has not received vaccination against flu yet.  Patient smokes about a pack a day.  Her cough is productive and producing green phlegm with some blood speckles in it.  Past Medical History:  Diagnosis Date   Back pain     Patient Active Problem List   Diagnosis Date Noted   Anxiety 05/24/2019    Past Surgical History:  Procedure Laterality Date   TUBAL LIGATION       OB History   No obstetric history on file.     History reviewed. No pertinent family history.  Social History   Tobacco Use   Smoking status: Some Days    Packs/day: 0.25    Types: Cigarettes   Smokeless tobacco: Never  Substance Use Topics   Alcohol use: Yes    Comment: socially    Drug use: Yes    Types: Marijuana    Home Medications Prior to Admission medications   Medication Sig Start Date End Date Taking? Authorizing Provider  albuterol (VENTOLIN HFA) 108 (90 Base) MCG/ACT inhaler Inhale 2 puffs into the lungs every 6 (six) hours as needed for wheezing or shortness of breath. 11/20/20   Mannie Stabile, PA-C  ALPRAZolam Prudy Feeler) 0.5 MG tablet Take 1  tablet (0.5 mg total) by mouth 2 (two) times daily as needed for anxiety. 05/17/19   Donnetta Hutching, MD  amoxicillin (AMOXIL) 875 MG tablet Take 1 tablet (875 mg total) by mouth 2 (two) times daily. 07/02/19   Eustace Moore, MD  Aspirin-Salicylamide-Caffeine [facility] HEADACHE PO) Take 1 packet by mouth daily as needed (pain).    [provider]  benzonatate (TESSALON) 100 MG capsule Take 1 capsule (100 mg total) by mouth every 8 (eight) hours. 11/20/20   Mannie Stabile, PA-C  fluticasone (FLONASE) 50 MCG/ACT nasal spray Place 2 sprays into both nostrils daily. 07/02/19   Eustace Moore, MD  methocarbamol (ROBAXIN) 500 MG tablet Take 1-2 tablets (500-1,000 mg total) by mouth 4 (four) times daily. 05/21/20   Renne Crigler, PA-C  Multiple Vitamin (MULTIVITAMIN ADULT) TABS Take 1 tablet by mouth daily.    [provider]  naproxen (NAPROSYN) 500 MG tablet Take 1 tablet (500 mg total) by mouth 2 (two) times daily. 05/21/20   Renne Crigler, PA-C  omeprazole (PRILOSEC) 20 MG capsule Take 1 capsule (20 mg total) by mouth daily. 03/11/20   Sabas Sous, MD  sertraline (ZOLOFT) 50 MG tablet TAKE 1 TABLET(50 MG) BY MOUTH DAILY 10/19/19   Georgian Co M, PA-C  sucralfate (CARAFATE) 1 g tablet Take 1  tablet (1 g total) by mouth 4 (four) times daily as needed. 03/11/20   Sabas Sous, MD    Allergies    Kiwi extract and Percocet [oxycodone-acetaminophen]  Review of Systems   Review of Systems  Constitutional:  Positive for activity change.  Respiratory:  Positive for cough and shortness of breath.   Cardiovascular:  Negative for chest pain.  All other systems reviewed and are negative.  Physical Exam Updated Vital Signs BP (!) 164/103   Pulse 100   Temp 98 F (36.7 C) (Oral)   Resp (!) 25   SpO2 100%   Physical Exam Vitals and nursing note reviewed.  Constitutional:      Appearance: She is well-developed.  HENT:     Head: Atraumatic.  Cardiovascular:     Rate and  Rhythm: Normal rate.  Pulmonary:     Effort: Tachypnea, accessory muscle usage and respiratory distress present.     Breath sounds: Wheezing present.  Musculoskeletal:     Cervical back: Normal range of motion and neck supple.     Right lower leg: Edema present.     Left lower leg: Edema present.     Comments: Bilateral 2+ pitting edema in the lower extremity  Skin:    General: Skin is warm and dry.  Neurological:     Mental Status: She is alert and oriented to person, place, and time.    ED Results / Procedures / Treatments   Labs (all labs ordered are listed, but only abnormal results are displayed) Labs Reviewed  COMPREHENSIVE METABOLIC PANEL - Abnormal; Notable for the following components:      Result Value   Potassium 3.4 (*)    Total Protein 8.2 (*)    All other components within normal limits  BLOOD GAS, VENOUS - Abnormal; Notable for the following components:   pO2, Ven 25.2 (*)    All other components within normal limits  I-STAT CHEM 8, ED - Abnormal; Notable for the following components:   Potassium 3.4 (*)    All other components within normal limits  RESP PANEL BY RT-PCR (FLU A&B, COVID) ARPGX2  CBC WITH DIFFERENTIAL/PLATELET  BRAIN NATRIURETIC PEPTIDE  LACTIC ACID, PLASMA  D-DIMER, QUANTITATIVE  I-STAT BETA HCG BLOOD, ED ([facility], [facility], [facility] ONLY)  TROPONIN I (HIGH SENSITIVITY)    EKG EKG Interpretation  Date/Time:  Monday March 09 2021 22:03:51 EST Ventricular Rate:  94 PR Interval:  158 QRS Duration: 78 QT Interval:  352 QTC Calculation: 441 R Axis:   47 Text Interpretation: Sinus rhythm No acute changes No significant change since last tracing Confirmed by Derwood Kaplan ([id number]) on 03/09/2021 11:33:56 PM  Radiology DG Chest Port 1 View  Result Date: 03/09/2021 CLINICAL DATA:  Shortness of breath. EXAM: PORTABLE CHEST 1 VIEW COMPARISON:  Chest radiograph dated 11/20/2020. FINDINGS: The heart size and mediastinal contours are within normal limits. Both  lungs are clear. The visualized skeletal structures are unremarkable. IMPRESSION: No active disease. Electronically Signed   By: Elgie Collard M.D.   On: 03/09/2021 22:02    Procedures .Critical Care Performed by: Derwood Kaplan, MD Authorized by: Derwood Kaplan, MD   Critical care provider statement:    Critical care time (minutes):  48   Critical care was necessary to treat or prevent imminent or life-threatening deterioration of the following conditions:  Respiratory failure   Critical care was time spent personally by me on the following activities:  Development of treatment plan with patient or surrogate, discussions  with consultants, evaluation of patient's response to treatment, examination of patient, ordering and review of laboratory studies, ordering and review of radiographic studies, ordering and performing treatments and interventions, pulse oximetry, re-evaluation of patient's condition and review of old charts   Medications Ordered in ED Medications  methylPREDNISolone sodium succinate (SOLU-MEDROL) 125 mg/2 mL injection 125 mg (has no administration in time range)  albuterol (PROVENTIL) (2.5 MG/3ML) 0.083% nebulizer solution 5 mg (5 mg Nebulization Given 03/09/21 2313)    ED Course  I have reviewed the triage vital signs and the nursing notes.  Pertinent labs & imaging results that were available during my care of the patient were reviewed by me and considered in my medical decision making (see chart for details).    MDM Rules/Calculators/A&P                         40 year old female with no significant medical history comes in with chief complaint of shortness of breath  She reports 2-day history of shortness of breath along with new pitting edema.  She also has some URI-like symptoms along with productive cough.  Initial thought is that patient might have suffered from viral URI and could be having myocarditis.  Other possibilities considered include CHF with  pulmonary edema, ACS and with recent history of COVID, PE.  Patient was in respiratory distress.  Unable to complete full sentences without having to stop.  She has generalized wheezing diffusely.  We placed her on BiPAP for work of breathing.  After receiving initial 5 mg of albuterol, patient's lung exam is improved.  With her being on BiPAP, she appears more calm.  However, her BNP, D-dimer, chest x-ray are all reassuring.  At this time, we will try to trial her off of BiPAP.  We will give her an hour-long breathing treatment  Dr. Blinda Leatherwood to follow-up on the patient and reassess.  If patient remains tachypneic or in respiratory distress, she might benefit with admission.  Otherwise she might be discharged with Lasix and antibiotics plus prednisone.  Final Clinical Impression(s) / ED Diagnoses Final diagnoses:  None    Rx / DC Orders ED Discharge Orders     None        Derwood Kaplan, MD 03/10/21 [phone]

## 2021-03-10 NOTE — ED Provider Notes (Signed)
Signed out to me by Dr. Rhunette Croft.  Patient seen with difficulty breathing.  Patient briefly on BiPAP at arrival but was quickly weaned to continuous nebulizer treatment and then room air.  She has been monitored.  She has done well.  Oxygen saturation 94% on room air currently, respiratory rate 19.  She is not labored.  She has very slight wheezing but is not in any respiratory distress.  At this point there does not appear to be an indication for admission.  Will discharge with doxycycline, prednisone, albuterol.  She will return if she has any worsening shortness of breath.   Gilda Crease, MD 03/10/21 [phone]

## 2021-03-10 NOTE — Progress Notes (Signed)
Per MD, attempt trial off bipap and give continuous nebulizer treatment.  Bipap mask removed and pt given albuterol 10mg  CAT with atrovent 0.50mg .  BBS wheezes throughout, rr low to mid 20s.  Bipap remains in room on standby as needed for increased wob.

## 2021-03-11 ENCOUNTER — Emergency Department (HOSPITAL_COMMUNITY): Payer: Medicaid Other

## 2021-03-11 ENCOUNTER — Other Ambulatory Visit: Payer: Self-pay

## 2021-03-11 ENCOUNTER — Inpatient Hospital Stay (HOSPITAL_COMMUNITY)
Admission: EM | Admit: 2021-03-11 | Discharge: 2021-03-15 | DRG: 191 | Disposition: A | Discharge status: Home / Self Care | Payer: Medicaid Other | Attending: Family Medicine | Admitting: Family Medicine

## 2021-03-11 ENCOUNTER — Encounter (HOSPITAL_COMMUNITY): Payer: Self-pay

## 2021-03-11 DIAGNOSIS — R059 Cough, unspecified: Secondary | ICD-10-CM | POA: Diagnosis not present

## 2021-03-11 DIAGNOSIS — Z885 Allergy status to narcotic agent status: Secondary | ICD-10-CM

## 2021-03-11 DIAGNOSIS — K219 Gastro-esophageal reflux disease without esophagitis: Secondary | ICD-10-CM | POA: Diagnosis present

## 2021-03-11 DIAGNOSIS — R0609 Other forms of dyspnea: Secondary | ICD-10-CM | POA: Diagnosis not present

## 2021-03-11 DIAGNOSIS — Y92239 Unspecified place in hospital as the place of occurrence of the external cause: Secondary | ICD-10-CM | POA: Diagnosis not present

## 2021-03-11 DIAGNOSIS — D649 Anemia, unspecified: Secondary | ICD-10-CM | POA: Diagnosis present

## 2021-03-11 DIAGNOSIS — Z66 Do not resuscitate: Secondary | ICD-10-CM | POA: Diagnosis present

## 2021-03-11 DIAGNOSIS — F17210 Nicotine dependence, cigarettes, uncomplicated: Secondary | ICD-10-CM | POA: Diagnosis present

## 2021-03-11 DIAGNOSIS — F411 Generalized anxiety disorder: Secondary | ICD-10-CM | POA: Diagnosis present

## 2021-03-11 DIAGNOSIS — J206 Acute bronchitis due to rhinovirus: Secondary | ICD-10-CM

## 2021-03-11 DIAGNOSIS — J40 Bronchitis, not specified as acute or chronic: Secondary | ICD-10-CM | POA: Diagnosis not present

## 2021-03-11 DIAGNOSIS — Z9851 Tubal ligation status: Secondary | ICD-10-CM

## 2021-03-11 DIAGNOSIS — D72829 Elevated white blood cell count, unspecified: Secondary | ICD-10-CM | POA: Diagnosis not present

## 2021-03-11 DIAGNOSIS — Z6841 Body Mass Index (BMI) 40.0 and over, adult: Secondary | ICD-10-CM

## 2021-03-11 DIAGNOSIS — F1290 Cannabis use, unspecified, uncomplicated: Secondary | ICD-10-CM | POA: Diagnosis present

## 2021-03-11 DIAGNOSIS — R0602 Shortness of breath: Secondary | ICD-10-CM | POA: Diagnosis not present

## 2021-03-11 DIAGNOSIS — T380X5A Adverse effect of glucocorticoids and synthetic analogues, initial encounter: Secondary | ICD-10-CM | POA: Diagnosis not present

## 2021-03-11 DIAGNOSIS — Z7952 Long term (current) use of systemic steroids: Secondary | ICD-10-CM

## 2021-03-11 DIAGNOSIS — R0600 Dyspnea, unspecified: Secondary | ICD-10-CM | POA: Diagnosis present

## 2021-03-11 DIAGNOSIS — J440 Chronic obstructive pulmonary disease with acute lower respiratory infection: Principal | ICD-10-CM | POA: Diagnosis present

## 2021-03-11 DIAGNOSIS — J9811 Atelectasis: Secondary | ICD-10-CM | POA: Diagnosis not present

## 2021-03-11 DIAGNOSIS — Z20822 Contact with and (suspected) exposure to covid-19: Secondary | ICD-10-CM | POA: Diagnosis present

## 2021-03-11 DIAGNOSIS — Z79899 Other long term (current) drug therapy: Secondary | ICD-10-CM

## 2021-03-11 DIAGNOSIS — J209 Acute bronchitis, unspecified: Secondary | ICD-10-CM

## 2021-03-11 DIAGNOSIS — J441 Chronic obstructive pulmonary disease with (acute) exacerbation: Secondary | ICD-10-CM | POA: Diagnosis present

## 2021-03-11 DIAGNOSIS — Z91018 Allergy to other foods: Secondary | ICD-10-CM

## 2021-03-11 LAB — CBC WITH DIFFERENTIAL/PLATELET
Abs Immature Granulocytes: 0.11 10*3/uL — ABNORMAL HIGH (ref 0.00–0.07)
Basophils Absolute: 0 10*3/uL (ref 0.0–0.1)
Basophils Relative: 0 %
Eosinophils Absolute: 0 10*3/uL (ref 0.0–0.5)
Eosinophils Relative: 0 %
HCT: 35.7 % — ABNORMAL LOW (ref 36.0–46.0)
Hemoglobin: 11.5 g/dL — ABNORMAL LOW (ref 12.0–15.0)
Immature Granulocytes: 1 %
Lymphocytes Relative: 10 %
Lymphs Abs: 2.5 10*3/uL (ref 0.7–4.0)
MCH: 29.3 pg (ref 26.0–34.0)
MCHC: 32.2 g/dL (ref 30.0–36.0)
MCV: 91.1 fL (ref 80.0–100.0)
Monocytes Absolute: 1.5 10*3/uL — ABNORMAL HIGH (ref 0.1–1.0)
Monocytes Relative: 6 %
Neutro Abs: 19.7 10*3/uL — ABNORMAL HIGH (ref 1.7–7.7)
Neutrophils Relative %: 83 %
Platelets: 412 10*3/uL — ABNORMAL HIGH (ref 150–400)
RBC: 3.92 MIL/uL (ref 3.87–5.11)
RDW: 14.6 % (ref 11.5–15.5)
WBC: 23.9 10*3/uL — ABNORMAL HIGH (ref 4.0–10.5)
nRBC: 0 % (ref 0.0–0.2)

## 2021-03-11 LAB — COMPREHENSIVE METABOLIC PANEL
ALT: 27 U/L (ref 0–44)
AST: 32 U/L (ref 15–41)
Albumin: 3.7 g/dL (ref 3.5–5.0)
Alkaline Phosphatase: 74 U/L (ref 38–126)
Anion gap: 6 (ref 5–15)
BUN: 18 mg/dL (ref 6–20)
CO2: 24 mmol/L (ref 22–32)
Calcium: 8.5 mg/dL — ABNORMAL LOW (ref 8.9–10.3)
Chloride: 108 mmol/L (ref 98–111)
Creatinine, Ser: 0.62 mg/dL (ref 0.44–1.00)
GFR, Estimated: >60 mL/min (ref >60)
Glucose, Bld: 102 mg/dL — ABNORMAL HIGH (ref 70–99)
Potassium: 4.7 mmol/L (ref 3.5–5.1)
Sodium: 138 mmol/L (ref 135–145)
Total Bilirubin: 1.1 mg/dL (ref 0.3–1.2)
Total Protein: 7.3 g/dL (ref 6.5–8.1)

## 2021-03-11 LAB — RESPIRATORY PANEL BY PCR

## 2021-03-11 LAB — CBC
HCT: 34.6 % — ABNORMAL LOW (ref 36.0–46.0)
Hemoglobin: 11.1 g/dL — ABNORMAL LOW (ref 12.0–15.0)
MCH: 29.6 pg (ref 26.0–34.0)
MCHC: 32.1 g/dL (ref 30.0–36.0)
MCV: 92.3 fL (ref 80.0–100.0)
Platelets: 371 10*3/uL (ref 150–400)
RBC: 3.75 MIL/uL — ABNORMAL LOW (ref 3.87–5.11)
RDW: 14.6 % (ref 11.5–15.5)
WBC: 18.3 10*3/uL — ABNORMAL HIGH (ref 4.0–10.5)
nRBC: 0 % (ref 0.0–0.2)

## 2021-03-11 LAB — URINALYSIS, ROUTINE W REFLEX MICROSCOPIC
Bilirubin Urine: NEGATIVE
Glucose, UA: NEGATIVE mg/dL
Ketones, ur: 5 mg/dL — AB
Leukocytes,Ua: NEGATIVE
Nitrite: NEGATIVE
Protein, ur: 30 mg/dL — AB
Specific Gravity, Urine: 1.032 — ABNORMAL HIGH (ref 1.005–1.030)
pH: 5 (ref 5.0–8.0)

## 2021-03-11 LAB — TROPONIN I (HIGH SENSITIVITY)
Troponin I (High Sensitivity): 6 ng/L (ref <18)
Troponin I (High Sensitivity): 4 ng/L (ref <18)

## 2021-03-11 LAB — BRAIN NATRIURETIC PEPTIDE: B Natriuretic Peptide: 65.2 pg/mL (ref 0.0–100.0)

## 2021-03-11 LAB — CREATININE, SERUM
Creatinine, Ser: 0.73 mg/dL (ref 0.44–1.00)
GFR, Estimated: >60 mL/min (ref >60)

## 2021-03-11 LAB — RESP PANEL BY RT-PCR (FLU A&B, COVID) ARPGX2
Influenza A by PCR: NEGATIVE
Influenza B by PCR: NEGATIVE
SARS Coronavirus 2 by RT PCR: NEGATIVE

## 2021-03-11 LAB — HIV ANTIBODY (ROUTINE TESTING W REFLEX): HIV Screen 4th Generation wRfx: NONREACTIVE

## 2021-03-11 MED ORDER — ALBUTEROL (5 MG/ML) CONTINUOUS INHALATION SOLN: 10.0000 mg/h | INHALATION_SOLUTION | Freq: Once | RESPIRATORY_TRACT | Status: DC

## 2021-03-11 MED ORDER — SODIUM CHLORIDE (PF) 0.9 % IJ SOLN
INTRAMUSCULAR | Status: AC
  Filled 2021-03-11: qty 50

## 2021-03-11 MED ORDER — MELATONIN 3 MG PO TABS
3.0000 mg | ORAL_TABLET | Freq: Every day | ORAL | Status: DC
  Administered 2021-03-11: 3 mg via ORAL
  Filled 2021-03-11: qty 1

## 2021-03-11 MED ORDER — METHYLPREDNISOLONE SODIUM SUCC 125 MG IJ SOLR
60.0000 mg | Freq: Two times a day (BID) | INTRAMUSCULAR | Status: DC
  Administered 2021-03-11: 60 mg via INTRAVENOUS
  Filled 2021-03-11: qty 2

## 2021-03-11 MED ORDER — HYDRALAZINE HCL 20 MG/ML IJ SOLN: 10.0000 mg | Freq: Three times a day (TID) | INTRAMUSCULAR | Status: DC | PRN

## 2021-03-11 MED ORDER — ALPRAZOLAM 0.5 MG PO TABS: 0.5000 mg | ORAL_TABLET | Freq: Two times a day (BID) | ORAL | Status: DC | PRN

## 2021-03-11 MED ORDER — PREDNISONE 20 MG PO TABS: 40.0000 mg | ORAL_TABLET | Freq: Every day | ORAL | Status: DC

## 2021-03-11 MED ORDER — IPRATROPIUM-ALBUTEROL 0.5-2.5 (3) MG/3ML IN SOLN
3.0000 mL | Freq: Four times a day (QID) | RESPIRATORY_TRACT | Status: DC
  Administered 2021-03-11 – 2021-03-15 (×15): 3 mL via RESPIRATORY_TRACT
  Filled 2021-03-11 (×15): qty 3

## 2021-03-11 MED ORDER — METHYLPREDNISOLONE SODIUM SUCC 125 MG IJ SOLR
125.0000 mg | Freq: Once | INTRAMUSCULAR | Status: AC
  Administered 2021-03-11: 125 mg via INTRAVENOUS
  Filled 2021-03-11: qty 2

## 2021-03-11 MED ORDER — ALBUTEROL SULFATE (2.5 MG/3ML) 0.083% IN NEBU
10.0000 mg/h | INHALATION_SOLUTION | Freq: Once | RESPIRATORY_TRACT | Status: AC
  Administered 2021-03-11: 10 mg/h via RESPIRATORY_TRACT
  Filled 2021-03-11: qty 12

## 2021-03-11 MED ORDER — IOHEXOL 350 MG/ML SOLN
100.0000 mL | Freq: Once | INTRAVENOUS | Status: AC | PRN
  Administered 2021-03-11: 100 mL via INTRAVENOUS

## 2021-03-11 MED ORDER — ENOXAPARIN SODIUM 60 MG/0.6ML IJ SOSY
50.0000 mg | PREFILLED_SYRINGE | INTRAMUSCULAR | Status: DC
  Administered 2021-03-11 – 2021-03-14 (×4): 50 mg via SUBCUTANEOUS
  Filled 2021-03-11 (×3): qty 0.6
  Filled 2021-03-11 (×2): qty 0.5

## 2021-03-11 MED ORDER — GUAIFENESIN ER 600 MG PO TB12
1200.0000 mg | ORAL_TABLET | Freq: Two times a day (BID) | ORAL | Status: DC
  Administered 2021-03-11 (×2): 1200 mg via ORAL
  Filled 2021-03-11 (×2): qty 2

## 2021-03-11 MED ORDER — ALBUTEROL SULFATE (2.5 MG/3ML) 0.083% IN NEBU
5.0000 mg | INHALATION_SOLUTION | Freq: Once | RESPIRATORY_TRACT | Status: AC
  Administered 2021-03-11: 5 mg via RESPIRATORY_TRACT
  Filled 2021-03-11: qty 6

## 2021-03-11 MED ORDER — IBUPROFEN 200 MG PO TABS
400.0000 mg | ORAL_TABLET | Freq: Four times a day (QID) | ORAL | Status: DC | PRN
  Administered 2021-03-11 – 2021-03-14 (×4): 400 mg via ORAL
  Filled 2021-03-11 (×4): qty 2

## 2021-03-11 NOTE — ED Provider Notes (Signed)
[facility]-EMERGENCY DEPT Provider Note   CSN: [id number] Arrival date & time: 03/11/21  0303     History Chief Complaint  Patient presents with   Shortness of Breath    Christina Rosales is a 40 y.o. female.  The history is provided by the patient and medical records.  Shortness of Breath Christina Rosales is a 40 y.o. female who presents to the Emergency Department complaining of sob. She presents to the ED for evaluation of sob that started on Saturday.  She reports sob with cough productive of green sputum (occasionally blood tinged), chills, subjective fever, bilateral lower extremity edema.  She presented to the ED for evaluation two days ago and was treated with Bipap, steroids, albuterol with improvement in sxs.  She was discharged home and later developed worsening sob/DOE.  She now has associated sharp, right sided chest pain.  She has no known medical problems.  She quit smoking one week ago.  No hx/o lung disease, DVT/PE.  She has an IUD.  Sxs are severe in nature.    She was discharged on doxy, prednisone and albuterol and has been compliant with meds.     Past Medical History:  Diagnosis Date   Back pain     Patient Active Problem List   Diagnosis Date Noted   Anxiety 05/24/2019    Past Surgical History:  Procedure Laterality Date   TUBAL LIGATION       OB History   No obstetric history on file.     History reviewed. No pertinent family history.  Social History   Tobacco Use   Smoking status: Some Days    Packs/day: 0.25    Types: Cigarettes   Smokeless tobacco: Never  Substance Use Topics   Alcohol use: Yes    Comment: socially    Drug use: Yes    Types: Marijuana    Home Medications Prior to Admission medications   Medication Sig Start Date End Date Taking? Authorizing Provider  albuterol (VENTOLIN HFA) 108 (90 Base) MCG/ACT inhaler Inhale 2 puffs into the lungs every 4 (four) hours as needed for wheezing or shortness of  breath. 03/10/21   Gilda Crease, MD  ALPRAZolam Prudy Feeler) 0.5 MG tablet Take 1 tablet (0.5 mg total) by mouth 2 (two) times daily as needed for anxiety. 05/17/19   Donnetta Hutching, MD  Aspirin-Salicylamide-Caffeine [facility] HEADACHE PO) Take 1 packet by mouth daily as needed (pain).    [provider]  doxycycline (VIBRAMYCIN) 100 MG capsule Take 1 capsule (100 mg total) by mouth 2 (two) times daily. 03/10/21   Gilda Crease, MD  fluticasone (FLONASE) 50 MCG/ACT nasal spray Place 2 sprays into both nostrils daily. 07/02/19   Eustace Moore, MD  Multiple Vitamin (MULTIVITAMIN ADULT) TABS Take 1 tablet by mouth daily.    [provider]  omeprazole (PRILOSEC) 20 MG capsule Take 1 capsule (20 mg total) by mouth daily. 03/11/20   Sabas Sous, MD  predniSONE (DELTASONE) 20 MG tablet Take 2 tablets (40 mg total) by mouth daily with breakfast. 03/10/21   Gilda Crease, MD  sertraline (ZOLOFT) 50 MG tablet TAKE 1 TABLET(50 MG) BY MOUTH DAILY 10/19/19   Georgian Co M, PA-C  sucralfate (CARAFATE) 1 g tablet Take 1 tablet (1 g total) by mouth 4 (four) times daily as needed. 03/11/20   Sabas Sous, MD    Allergies    Kiwi extract and Percocet [oxycodone-acetaminophen]  Review of Systems   Review  of Systems  Respiratory:  Positive for shortness of breath.   All other systems reviewed and are negative.  Physical Exam Updated Vital Signs BP 139/81   Pulse 95   Temp 98.4 F (36.9 C) (Oral)   Resp (!) 22   SpO2 93%   Physical Exam Vitals and nursing note reviewed.  Constitutional:      Appearance: She is well-developed.  HENT:     Head: Normocephalic and atraumatic.  Cardiovascular:     Rate and Rhythm: Normal rate and regular rhythm.     Heart sounds: No murmur heard. Pulmonary:     Effort: Pulmonary effort is normal. No respiratory distress.     Comments: Diffuse rhonchi, tachypnea.  Mildly decreased air movement bilaterally.  Abdominal:      Palpations: Abdomen is soft.     Tenderness: There is no abdominal tenderness. There is no guarding or rebound.  Musculoskeletal:        General: No tenderness.     Comments: Trace pitting edema to BLE.   Skin:    General: Skin is warm and dry.  Neurological:     Mental Status: She is alert and oriented to person, place, and time.  Psychiatric:        Behavior: Behavior normal.    ED Results / Procedures / Treatments   Labs (all labs ordered are listed, but only abnormal results are displayed) Labs Reviewed  COMPREHENSIVE METABOLIC PANEL - Abnormal; Notable for the following components:      Result Value   Glucose, Bld 102 (*)    Calcium 8.5 (*)    All other components within normal limits  CBC WITH DIFFERENTIAL/PLATELET - Abnormal; Notable for the following components:   WBC 23.9 (*)    Hemoglobin 11.5 (*)    HCT 35.7 (*)    Platelets 412 (*)    Neutro Abs 19.7 (*)    Monocytes Absolute 1.5 (*)    Abs Immature Granulocytes 0.11 (*)    All other components within normal limits  URINALYSIS, ROUTINE W REFLEX MICROSCOPIC - Abnormal; Notable for the following components:   Specific Gravity, Urine 1.032 (*)    Hgb urine dipstick SMALL (*)    Ketones, ur 5 (*)    Protein, ur 30 (*)    Bacteria, UA RARE (*)    All other components within normal limits  RESP PANEL BY RT-PCR (FLU A&B, COVID) ARPGX2  BRAIN NATRIURETIC PEPTIDE  TROPONIN I (HIGH SENSITIVITY)  TROPONIN I (HIGH SENSITIVITY)    EKG EKG Interpretation  Date/Time:  Wednesday March 11 2021 03:14:03 EST Ventricular Rate:  81 PR Interval:  146 QRS Duration: 88 QT Interval:  358 QTC Calculation: 416 R Axis:   59 Text Interpretation: Sinus rhythm Confirmed by Tilden Fossa [id number]) on 03/11/2021 3:34:48 AM  Radiology CT Angio Chest PE W/Cm &/Or Wo Cm  Result Date: 03/11/2021 CLINICAL DATA:  Shortness of breath EXAM: CT ANGIOGRAPHY CHEST WITH CONTRAST TECHNIQUE: Multidetector CT imaging of the chest was  performed using the standard protocol during bolus administration of intravenous contrast. Multiplanar CT image reconstructions and MIPs were obtained to evaluate the vascular anatomy. CONTRAST:  OMNIPAQUE IOHEXOL 350 MG/ML SOLN COMPARISON:  None. FINDINGS: Cardiovascular: Suboptimal opacification of the pulmonary arteries to the segmental level due to motion, streak, and bolus dispersion. No evidence of pulmonary embolism. Normal heart size. No pericardial effusion. Mediastinum/Nodes: Negative for adenopathy or mass Lungs/Pleura: Generalized airway thickening with atelectasis and bronchial narrowing/collapse. There is no edema, consolidation,  effusion, or pneumothorax. Upper Abdomen: No acute abnormality. Musculoskeletal: No chest wall abnormality. No acute or significant osseous findings. Review of the MIP images confirms the above findings. IMPRESSION: 1. Generalized bronchitis with airway narrowing and subsegmental atelectasis. 2. No evidence of pulmonary embolism. 3. Suboptimal CTA due to motion and bolus dispersion. Electronically Signed   By: Tiburcio Pea M.D.   On: 03/11/2021 06:34   DG Chest Portable 1 View  Result Date: 03/11/2021 CLINICAL DATA:  Cough and shortness of breath EXAM: PORTABLE CHEST 1 VIEW COMPARISON:  03/09/2021 FINDINGS: The heart size and mediastinal contours are within normal limits. Both lungs are clear. The visualized skeletal structures are unremarkable. IMPRESSION: No active disease. Electronically Signed   By: Alcide Clever M.D.   On: 03/11/2021 03:27   DG Chest Port 1 View  Result Date: 03/09/2021 CLINICAL DATA:  Shortness of breath. EXAM: PORTABLE CHEST 1 VIEW COMPARISON:  Chest radiograph dated 11/20/2020. FINDINGS: The heart size and mediastinal contours are within normal limits. Both lungs are clear. The visualized skeletal structures are unremarkable. IMPRESSION: No active disease. Electronically Signed   By: Elgie Collard M.D.   On: 03/09/2021 22:02     Procedures Procedures   Medications Ordered in ED Medications  albuterol (PROVENTIL) (2.5 MG/3ML) 0.083% nebulizer solution 5 mg (has no administration in time range)  albuterol (PROVENTIL) (2.5 MG/3ML) 0.083% nebulizer solution (10 mg/hr Nebulization Given 03/11/21 0407)  methylPREDNISolone sodium succinate (SOLU-MEDROL) 125 mg/2 mL injection 125 mg (125 mg Intravenous Given 03/11/21 0511)  iohexol (OMNIPAQUE) 350 MG/ML injection 100 mL (100 mLs Intravenous Contrast Given 03/11/21 0603)  sodium chloride (PF) 0.9 % injection (  Given by Other 03/11/21 1761)    ED Course  I have reviewed the triage vital signs and the nursing notes.  Pertinent labs & imaging results that were available during my care of the patient were reviewed by me and considered in my medical decision making (see chart for details).    MDM Rules/Calculators/A&P                          Patient without significant medical history here for evaluation of worsening shortness of breath, evaluated yesterday in the emergency department for similar symptoms.  She is feeling improved at the time of her ED discharge yesterday and has been compliant with all of her medications.  She returns today due to feeling like fluid is filling her lungs.  On initial assessment patient with tachypnea, increased work of breathing and diffuse rhonchi.  She was treated with continuous nebulizer treatment, steroids with partial improvement in her symptoms.  She does have persistent wheezing and rhonchi on reassessment.  CTA is negative for PE, pneumonia but is consistent with bronchitis.  She was trialed with ambulation and did not have hypoxia but had significant dyspnea with increased work of breathing with minimal exertion.  She was returned to the stretcher and placed on supplemental oxygen with improvement in her symptoms.  Repeat lung exam is similar after she was allowed to rest with wheezes and rhonchi.  Will provide additional nebulizer  treatment.  Given persistent profound dyspnea on exertion recommend observation admission.  Medicine consulted for admission.  Final Clinical Impression(s) / ED Diagnoses Final diagnoses:  None    Rx / DC Orders ED Discharge Orders     None        Tilden Fossa, MD 03/11/21 [phone]

## 2021-03-11 NOTE — ED Triage Notes (Signed)
Pt reports with [facility]. Pt was recently seen and dx with fluid in her lungs. Pt was told to come back if symptoms worsened.

## 2021-03-11 NOTE — H&P (Signed)
History and Physical    Christina Rosales IZT:[id number] DOB: 11-Jul-1980 DOA: 03/11/2021  PCP: Patient, No Pcp Per (Inactive)  Patient coming from: Home  Chief Complaint: dyspnea  HPI: Christina Rosales is a 40 y.o. female with medical history significant of GERD, GAD, morbid obesity. Presenting with dyspnea. Her symptoms started 5 days ago. She has productive cough and chills. She became concerned and presented to the ED. Initially she was on BiPap, but then weaned. She was monitored for several hours. She was then sent home in no distress w/ doxycycline, prednisone, and albuterol. She states that yesterday she felt short of breath again before going to work. While she was at work, she felt short of breath with every activity. She had to stop to catch her breath just walking the office space. She spoke with her supervisor and was convinced to come to the ED. She denies any other aggravating or alleviating factors.   ED Course: Vitals were wnl. BNP and trp were wnl. EKG was ok. CXR was negative. CTA chest was negative for clot. It did show bronchitis. She was started on nebs and steroids. She had some improvement after her initial rounds. However, she was still significantly dyspneic w/ ambulation. TRH was called for admission.   Review of Systems:  Denies abdominal pain, N/V/D, sick contacts. Reports right side chest pain w/ cough. Review of systems is otherwise negative for all not mentioned in HPI.   PMHx Past Medical History:  Diagnosis Date   Back pain     PSHx Past Surgical History:  Procedure Laterality Date   TUBAL LIGATION      SocHx  reports that she has been smoking. She has been smoking an average of 1 packs per day. She has never used smokeless tobacco. She reports current alcohol use. She reports current drug use. Drug: Marijuana (daily use)  Allergies  Allergen Reactions   Kiwi Extract    Percocet [Oxycodone-Acetaminophen]     FamHx History reviewed. No pertinent family  history.  Prior to Admission medications   Medication Sig Start Date End Date Taking? Authorizing Provider  albuterol (VENTOLIN HFA) 108 (90 Base) MCG/ACT inhaler Inhale 2 puffs into the lungs every 4 (four) hours as needed for wheezing or shortness of breath. 03/10/21   Gilda Crease, MD  ALPRAZolam Prudy Feeler) 0.5 MG tablet Take 1 tablet (0.5 mg total) by mouth 2 (two) times daily as needed for anxiety. 05/17/19   Donnetta Hutching, MD  Aspirin-Salicylamide-Caffeine [facility] HEADACHE PO) Take 1 packet by mouth daily as needed (pain).    [provider]  doxycycline (VIBRAMYCIN) 100 MG capsule Take 1 capsule (100 mg total) by mouth 2 (two) times daily. 03/10/21   Gilda Crease, MD  fluticasone (FLONASE) 50 MCG/ACT nasal spray Place 2 sprays into both nostrils daily. 07/02/19   Eustace Moore, MD  Multiple Vitamin (MULTIVITAMIN ADULT) TABS Take 1 tablet by mouth daily.    [provider]  omeprazole (PRILOSEC) 20 MG capsule Take 1 capsule (20 mg total) by mouth daily. 03/11/20   Sabas Sous, MD  predniSONE (DELTASONE) 20 MG tablet Take 2 tablets (40 mg total) by mouth daily with breakfast. 03/10/21   Gilda Crease, MD  sertraline (ZOLOFT) 50 MG tablet TAKE 1 TABLET(50 MG) BY MOUTH DAILY 10/19/19   Georgian Co M, PA-C  sucralfate (CARAFATE) 1 g tablet Take 1 tablet (1 g total) by mouth 4 (four) times daily as needed. 03/11/20   Sabas Sous, MD  Physical Exam: Vitals:   03/11/21 0415 03/11/21 0600 03/11/21 0626 03/11/21 0730  BP: (!) 138/94  134/74 139/81  Pulse: 81 (!) 105 99 95  Resp: 18 20 18  (!) 22  Temp:      TempSrc:      SpO2: 100% 90% 96% 93%    General: 40 y.o. female resting in bed in NAD Eyes: PERRL, normal sclera ENMT: Nares patent w/o discharge, orophaynx clear, dentition normal, ears w/o discharge/lesions/ulcers Neck: Supple, trachea midline Cardiovascular: RRR, +S1, S2, no m/g/r, equal pulses throughout, reproducible chest discomfort  about lateral ribs Respiratory: b/l insp/exp wheeze noted, b/l soft rhochi noted, slight increased WOB on 2L [place] GI: BS+, obese, NDNT, no masses noted, no organomegaly noted MSK: No c/c; trace b/l pedal edema Neuro: A&O x 3, no focal deficits Psyc: Appropriate interaction and affect, calm/cooperative  Labs on Admission: I have personally reviewed following labs and imaging studies  CBC: Recent Labs  Lab 03/09/21 2302 03/09/21 2311 03/11/21 0342  WBC 9.2  --  23.9*  NEUTROABS 6.7  --  19.7*  HGB 12.9 13.9 11.5*  HCT 40.5 41.0 35.7*  MCV 92.0  --  91.1  PLT 379  --  412*   Basic Metabolic Panel: Recent Labs  Lab 03/09/21 2302 03/09/21 2311 03/11/21 0342  NA 138 141 138  K 3.4* 3.4* 4.7  CL 105 103 108  CO2 27  --  24  GLUCOSE 83 79 102*  BUN 9 6 18   CREATININE 0.80 0.70 0.62  CALCIUM 8.9  --  8.5*   GFR: CrCl cannot be calculated (Unknown ideal weight.). Liver Function Tests: Recent Labs  Lab 03/09/21 2302 03/11/21 0342  AST 17 32  ALT 22 27  ALKPHOS 93 74  BILITOT 0.7 1.1  PROT 8.2* 7.3  ALBUMIN 4.2 3.7   No results for input(s): LIPASE, AMYLASE in the last 168 hours. No results for input(s): AMMONIA in the last 168 hours. Coagulation Profile: No results for input(s): INR, PROTIME in the last 168 hours. Cardiac Enzymes: No results for input(s): CKTOTAL, CKMB, CKMBINDEX, TROPONINI in the last 168 hours. BNP (last 3 results) No results for input(s): PROBNP in the last 8760 hours. HbA1C: No results for input(s): HGBA1C in the last 72 hours. CBG: No results for input(s): GLUCAP in the last 168 hours. Lipid Profile: No results for input(s): CHOL, HDL, LDLCALC, TRIG, CHOLHDL, LDLDIRECT in the last 72 hours. Thyroid Function Tests: No results for input(s): TSH, T4TOTAL, FREET4, T3FREE, THYROIDAB in the last 72 hours. Anemia Panel: No results for input(s): VITAMINB12, FOLATE, FERRITIN, TIBC, IRON, RETICCTPCT in the last 72 hours. Urine analysis:     Component Value Date/Time   COLORURINE YELLOW 03/11/2021 0337   APPEARANCEUR CLEAR 03/11/2021 0337   LABSPEC 1.032 (H) 03/11/2021 0337   PHURINE 5.0 03/11/2021 0337   GLUCOSEU NEGATIVE 03/11/2021 0337   HGBUR SMALL (A) 03/11/2021 0337   BILIRUBINUR NEGATIVE 03/11/2021 0337   KETONESUR 5 (A) 03/11/2021 0337   PROTEINUR 30 (A) 03/11/2021 0337   NITRITE NEGATIVE 03/11/2021 0337   LEUKOCYTESUR NEGATIVE 03/11/2021 14/10/2020    Radiological Exams on Admission: CT Angio Chest PE W/Cm &/Or Wo Cm  Result Date: 03/11/2021 CLINICAL DATA:  Shortness of breath EXAM: CT ANGIOGRAPHY CHEST WITH CONTRAST TECHNIQUE: Multidetector CT imaging of the chest was performed using the standard protocol during bolus administration of intravenous contrast. Multiplanar CT image reconstructions and MIPs were obtained to evaluate the vascular anatomy. CONTRAST:  3009 OMNIPAQUE IOHEXOL 350 MG/ML SOLN  COMPARISON:  None. FINDINGS: Cardiovascular: Suboptimal opacification of the pulmonary arteries to the segmental level due to motion, streak, and bolus dispersion. No evidence of pulmonary embolism. Normal heart size. No pericardial effusion. Mediastinum/Nodes: Negative for adenopathy or mass Lungs/Pleura: Generalized airway thickening with atelectasis and bronchial narrowing/collapse. There is no edema, consolidation, effusion, or pneumothorax. Upper Abdomen: No acute abnormality. Musculoskeletal: No chest wall abnormality. No acute or significant osseous findings. Review of the MIP images confirms the above findings. IMPRESSION: 1. Generalized bronchitis with airway narrowing and subsegmental atelectasis. 2. No evidence of pulmonary embolism. 3. Suboptimal CTA due to motion and bolus dispersion. Electronically Signed   By: Tiburcio Pea M.D.   On: 03/11/2021 06:34   DG Chest Portable 1 View  Result Date: 03/11/2021 CLINICAL DATA:  Cough and shortness of breath EXAM: PORTABLE CHEST 1 VIEW COMPARISON:  03/09/2021 FINDINGS: The  heart size and mediastinal contours are within normal limits. Both lungs are clear. The visualized skeletal structures are unremarkable. IMPRESSION: No active disease. Electronically Signed   By: Alcide Clever M.D.   On: 03/11/2021 03:27   DG Chest Port 1 View  Result Date: 03/09/2021 CLINICAL DATA:  Shortness of breath. EXAM: PORTABLE CHEST 1 VIEW COMPARISON:  Chest radiograph dated 11/20/2020. FINDINGS: The heart size and mediastinal contours are within normal limits. Both lungs are clear. The visualized skeletal structures are unremarkable. IMPRESSION: No active disease. Electronically Signed   By: Elgie Collard M.D.   On: 03/09/2021 22:02    EKG: Independently reviewed. Sinus, no st elevations  Assessment/Plan Dyspnea on exertion Bronchitis     - place in obs, tele     - BNP and trp wnl     - EKG is ok     - lung exam still shows a decent amount of wheeze     - CTA chest negative for clot/edema/PNA, but not the best study; she did have generalize bronchitis and airway narrowing     - wean O2 as able     - COVID/flu negative     - check RVP     - continue steroids, nebs; add guaifenesin  GERD     - PPI  GAD     - resume home regimen  Morbid obesity     - BMI is 44     - counsel on diet, lifestyle changes     - follow up outpt  DVT prophylaxis: lovenox  Code Status: DNR (confirmed with patient through multiple question patterns)  Family Communication: asleep at bedside  Consults called: None   Status is: Observation  The patient remains OBS appropriate and will d/c before 2 midnights.  Teddy Spike DO [facility]  If 7PM-7AM, please contact night-coverage [email or web address]  03/11/2021, 8:17 AM

## 2021-03-12 DIAGNOSIS — Z9851 Tubal ligation status: Secondary | ICD-10-CM | POA: Diagnosis not present

## 2021-03-12 DIAGNOSIS — F411 Generalized anxiety disorder: Secondary | ICD-10-CM | POA: Diagnosis not present

## 2021-03-12 DIAGNOSIS — J440 Chronic obstructive pulmonary disease with acute lower respiratory infection: Secondary | ICD-10-CM | POA: Diagnosis not present

## 2021-03-12 DIAGNOSIS — Z66 Do not resuscitate: Secondary | ICD-10-CM | POA: Diagnosis not present

## 2021-03-12 DIAGNOSIS — Z6841 Body Mass Index (BMI) 40.0 and over, adult: Secondary | ICD-10-CM | POA: Diagnosis not present

## 2021-03-12 DIAGNOSIS — J206 Acute bronchitis due to rhinovirus: Secondary | ICD-10-CM | POA: Diagnosis not present

## 2021-03-12 DIAGNOSIS — Z885 Allergy status to narcotic agent status: Secondary | ICD-10-CM | POA: Diagnosis not present

## 2021-03-12 DIAGNOSIS — K219 Gastro-esophageal reflux disease without esophagitis: Secondary | ICD-10-CM | POA: Diagnosis not present

## 2021-03-12 DIAGNOSIS — J40 Bronchitis, not specified as acute or chronic: Secondary | ICD-10-CM | POA: Diagnosis not present

## 2021-03-12 DIAGNOSIS — R0602 Shortness of breath: Secondary | ICD-10-CM | POA: Diagnosis not present

## 2021-03-12 DIAGNOSIS — T380X5A Adverse effect of glucocorticoids and synthetic analogues, initial encounter: Secondary | ICD-10-CM | POA: Diagnosis not present

## 2021-03-12 DIAGNOSIS — D72829 Elevated white blood cell count, unspecified: Secondary | ICD-10-CM | POA: Diagnosis not present

## 2021-03-12 DIAGNOSIS — J9811 Atelectasis: Secondary | ICD-10-CM | POA: Diagnosis not present

## 2021-03-12 DIAGNOSIS — Y92239 Unspecified place in hospital as the place of occurrence of the external cause: Secondary | ICD-10-CM | POA: Diagnosis not present

## 2021-03-12 DIAGNOSIS — J441 Chronic obstructive pulmonary disease with (acute) exacerbation: Secondary | ICD-10-CM | POA: Insufficient documentation

## 2021-03-12 DIAGNOSIS — R059 Cough, unspecified: Secondary | ICD-10-CM | POA: Diagnosis not present

## 2021-03-12 DIAGNOSIS — Z20822 Contact with and (suspected) exposure to covid-19: Secondary | ICD-10-CM | POA: Diagnosis not present

## 2021-03-12 DIAGNOSIS — R0600 Dyspnea, unspecified: Secondary | ICD-10-CM | POA: Diagnosis not present

## 2021-03-12 DIAGNOSIS — Z91018 Allergy to other foods: Secondary | ICD-10-CM | POA: Diagnosis not present

## 2021-03-12 DIAGNOSIS — D649 Anemia, unspecified: Secondary | ICD-10-CM | POA: Diagnosis not present

## 2021-03-12 DIAGNOSIS — Z79899 Other long term (current) drug therapy: Secondary | ICD-10-CM | POA: Diagnosis not present

## 2021-03-12 DIAGNOSIS — F17210 Nicotine dependence, cigarettes, uncomplicated: Secondary | ICD-10-CM | POA: Diagnosis not present

## 2021-03-12 DIAGNOSIS — F1290 Cannabis use, unspecified, uncomplicated: Secondary | ICD-10-CM | POA: Diagnosis present

## 2021-03-12 DIAGNOSIS — Z7952 Long term (current) use of systemic steroids: Secondary | ICD-10-CM | POA: Diagnosis not present

## 2021-03-12 DIAGNOSIS — J209 Acute bronchitis, unspecified: Secondary | ICD-10-CM | POA: Diagnosis not present

## 2021-03-12 LAB — COMPREHENSIVE METABOLIC PANEL
ALT: 25 U/L (ref 0–44)
AST: 15 U/L (ref 15–41)
Albumin: 3.4 g/dL — ABNORMAL LOW (ref 3.5–5.0)
Alkaline Phosphatase: 72 U/L (ref 38–126)
Anion gap: 6 (ref 5–15)
BUN: 18 mg/dL (ref 6–20)
CO2: 24 mmol/L (ref 22–32)
Calcium: 8.6 mg/dL — ABNORMAL LOW (ref 8.9–10.3)
Chloride: 107 mmol/L (ref 98–111)
Creatinine, Ser: 0.51 mg/dL (ref 0.44–1.00)
GFR, Estimated: >60 mL/min (ref >60)
Glucose, Bld: 124 mg/dL — ABNORMAL HIGH (ref 70–99)
Potassium: 3.8 mmol/L (ref 3.5–5.1)
Sodium: 137 mmol/L (ref 135–145)
Total Bilirubin: 0.4 mg/dL (ref 0.3–1.2)
Total Protein: 6.8 g/dL (ref 6.5–8.1)

## 2021-03-12 LAB — CBC
HCT: 34 % — ABNORMAL LOW (ref 36.0–46.0)
Hemoglobin: 11 g/dL — ABNORMAL LOW (ref 12.0–15.0)
MCH: 29.7 pg (ref 26.0–34.0)
MCHC: 32.4 g/dL (ref 30.0–36.0)
MCV: 91.9 fL (ref 80.0–100.0)
Platelets: 389 10*3/uL (ref 150–400)
RBC: 3.7 MIL/uL — ABNORMAL LOW (ref 3.87–5.11)
RDW: 14.6 % (ref 11.5–15.5)
WBC: 19.3 10*3/uL — ABNORMAL HIGH (ref 4.0–10.5)
nRBC: 0 % (ref 0.0–0.2)

## 2021-03-12 MED ORDER — DOXYCYCLINE HYCLATE 100 MG PO TABS
100.0000 mg | ORAL_TABLET | Freq: Two times a day (BID) | ORAL | Status: DC
  Administered 2021-03-12 – 2021-03-15 (×7): 100 mg via ORAL
  Filled 2021-03-12 (×8): qty 1

## 2021-03-12 MED ORDER — MELATONIN 3 MG PO TABS
3.0000 mg | ORAL_TABLET | Freq: Every evening | ORAL | Status: DC | PRN
  Administered 2021-03-12 – 2021-03-14 (×3): 3 mg via ORAL
  Filled 2021-03-12 (×3): qty 1

## 2021-03-12 MED ORDER — PANTOPRAZOLE SODIUM 40 MG PO TBEC
40.0000 mg | DELAYED_RELEASE_TABLET | Freq: Every day | ORAL | Status: DC
  Administered 2021-03-12 – 2021-03-15 (×4): 40 mg via ORAL
  Filled 2021-03-12 (×5): qty 1

## 2021-03-12 MED ORDER — GUAIFENESIN ER 600 MG PO TB12
600.0000 mg | ORAL_TABLET | Freq: Two times a day (BID) | ORAL | Status: DC
  Administered 2021-03-12 – 2021-03-15 (×7): 600 mg via ORAL
  Filled 2021-03-12 (×7): qty 1

## 2021-03-12 MED ORDER — GUAIFENESIN-DM 100-10 MG/5ML PO SYRP: 10.0000 mL | ORAL_SOLUTION | ORAL | Status: DC | PRN

## 2021-03-12 MED ORDER — TRAMADOL HCL 50 MG PO TABS
50.0000 mg | ORAL_TABLET | Freq: Four times a day (QID) | ORAL | Status: DC | PRN
  Administered 2021-03-12 – 2021-03-14 (×5): 50 mg via ORAL
  Filled 2021-03-12 (×6): qty 1

## 2021-03-12 MED ORDER — NICOTINE 21 MG/24HR TD PT24
21.0000 mg | MEDICATED_PATCH | Freq: Every day | TRANSDERMAL | Status: DC
  Administered 2021-03-12 – 2021-03-15 (×4): 21 mg via TRANSDERMAL
  Filled 2021-03-12 (×4): qty 1

## 2021-03-12 MED ORDER — METHYLPREDNISOLONE SODIUM SUCC 125 MG IJ SOLR
60.0000 mg | Freq: Two times a day (BID) | INTRAMUSCULAR | Status: DC
  Administered 2021-03-12 – 2021-03-15 (×7): 60 mg via INTRAVENOUS
  Filled 2021-03-12 (×7): qty 2

## 2021-03-12 MED ORDER — ALBUTEROL SULFATE (2.5 MG/3ML) 0.083% IN NEBU
2.5000 mg | INHALATION_SOLUTION | RESPIRATORY_TRACT | Status: DC | PRN
  Administered 2021-03-12 (×2): 2.5 mg via RESPIRATORY_TRACT
  Filled 2021-03-12 (×2): qty 3

## 2021-03-12 MED ORDER — GUAIFENESIN-DM 100-10 MG/5ML PO SYRP
5.0000 mL | ORAL_SOLUTION | Freq: Four times a day (QID) | ORAL | Status: DC | PRN
  Administered 2021-03-13 – 2021-03-14 (×2): 5 mL via ORAL
  Filled 2021-03-12 (×2): qty 5

## 2021-03-12 NOTE — Progress Notes (Addendum)
PROGRESS NOTE   Christina Rosales  SAY:[id number]    DOB: [birth year]    DOA: 03/11/2021  PCP: Patient, No Pcp Per (Inactive)   I have briefly reviewed patients previous medical records in [facility].  Chief Complaint  Patient presents with   Shortness of Breath    Brief Narrative:  40 year old female, works as Lawyer, medical history significant for GERD, GAD, morbid obesity, ongoing tobacco use, presented to the ED with complaints of progressive dyspnea, productive cough and chills.  Reportedly initially she was on BiPAP, then weaned to room air, monitored for several hours and discharged home with doxycycline, prednisone and albuterol but returned with persistent and progressive symptoms.  Admitted for acute rhinovirus bronchitis complicating possible underlying COPD with exacerbation and possible OSA.   Assessment & Plan:  Principal Problem:   Dyspnea   Acute rhinovirus bronchitis complicating possible COPD (undiagnosed) with exacerbation and possible OSA (undiagnosed): -RVP positive for rhinovirus.  Influenza A&B and coronavirus 19 RT-PCR negative. -CTA chest showed generalized bronchitis with airway narrowing and subsegmental atelectasis.  No evidence of pulmonary embolism. -BNP: 65.2.  High-sensitivity troponins x2 negative. -Significant clinical bronchospasm although not hypoxic on room air. -Continue IV Solu-Medrol 60 mg every 12 hours, scheduled and as needed bronchodilator nebulizations, Mucinex, as needed antitussives and flutter valve.  Added doxycycline in case she has an element of superadded bacterial infection given productive cough. - Added Protonix in case she has subtle wheeze related to her GERD. -Monitor closely. -Recommended outpatient PFTs and sleep study. -Tobacco cessation counseled.  GERD -PPI  Tobacco use disorder -Cessation counseled.  Placed nicotine patch.  GAD - Does not appear to be on any meds PTA.  Anemia - Stable.  Outpatient  follow-up.  Leukocytosis - Likely due to steroids and stress response.  Body mass index is 43.4 kg/m./Morbid obesity -Has been counseled regarding lifestyle modifications and weight loss.     DVT prophylaxis:   Lovenox   Code Status: Full Code.  She was listed as a DNR.  I discussed with her in detail regarding what this means.  She says that she got it "upside down" and requested that I change to full code. Family Communication: Discussed with patient's aunt at bedside who reportedly works at [employer]. Disposition:  Status is: Observation  The patient will require care spanning > 2 midnights and should be moved to inpatient because: Acute bronchitis with suspected COPD exacerbation and significant dyspnea and wheezing requiring close monitoring, IV meds.       Consultants:   None  Procedures:   None  Antimicrobials:   Doxycycline 12/8 >   Subjective:  Seen this morning while she was still in the ED.  Aunt at bedside.  Objective:   Vitals:   03/12/21 0921 03/12/21 1200 03/12/21 1500 03/12/21 1740  BP: (!) 151/97 (!) 151/95 (!) 164/97 (!) 142/82  Pulse: 84 92 100 90  Resp: 20 19 (!) 23 16  Temp:   98.3 F (36.8 C) 98.4 F (36.9 C)  TempSrc:   Oral Oral  SpO2: 98% 95% 97% 95%  Weight:      Height:        General exam: Young female, moderately built, morbidly obese, lying propped up in bed with mild dyspnea.  Audible wheezing. Respiratory system: Reduced breath sounds bilaterally/harsh breath sounds with scattered bilateral medium pitched expiratory rhonchi.  No crackles.  No accessory muscle use.  Mild increased work of breathing. Cardiovascular system: S1 & S2 heard, RRR. No  JVD, murmurs, rubs, gallops or clicks.  Trace bilateral leg edema.  Telemetry personally reviewed: Sinus rhythm. Gastrointestinal system: Abdomen is nondistended, soft and nontender. No organomegaly or masses felt. Normal bowel sounds heard. Central nervous system: Alert and  oriented. No focal neurological deficits. Extremities: Symmetric 5 x 5 power. Skin: No rashes, lesions or ulcers Psychiatry: Judgement and insight appear normal. Mood & affect appropriate.     Data Reviewed:   I have personally reviewed following labs and imaging studies   CBC: Recent Labs  Lab 03/09/21 2302 03/09/21 2311 03/11/21 0342 03/11/21 1221 03/12/21 0700  WBC 9.2  --  23.9* 18.3* 19.3*  NEUTROABS 6.7  --  19.7*  --   --   HGB 12.9   < > 11.5* 11.1* 11.0*  HCT 40.5   < > 35.7* 34.6* 34.0*  MCV 92.0  --  91.1 92.3 91.9  PLT 379  --  412* 371 389   < > = values in this interval not displayed.    Basic Metabolic Panel: Recent Labs  Lab 03/09/21 2302 03/09/21 2311 03/11/21 0342 03/11/21 1221 03/12/21 0700  NA 138 141 138  --  137  K 3.4* 3.4* 4.7  --  3.8  CL 105 103 108  --  107  CO2 27  --  24  --  24  GLUCOSE 83 79 102*  --  124*  BUN 9 6 18   --  18  CREATININE 0.80 0.70 0.62 0.73 0.51  CALCIUM 8.9  --  8.5*  --  8.6*    Liver Function Tests: Recent Labs  Lab 03/09/21 2302 03/11/21 0342 03/12/21 0700  AST 17 32 15  ALT 22 27 25   ALKPHOS 93 74 72  BILITOT 0.7 1.1 0.4  PROT 8.2* 7.3 6.8  ALBUMIN 4.2 3.7 3.4*    CBG: No results for input(s): GLUCAP in the last 168 hours.  Microbiology Studies:   Recent Results (from the past 240 hour(s))  Resp Panel by RT-PCR (Flu A&B, Covid) Nasopharyngeal Swab     Status: None   Collection Time: 03/09/21  9:32 PM   Specimen: Nasopharyngeal Swab; Nasopharyngeal(NP) swabs in vial transport medium  Result Value Ref Range Status   SARS Coronavirus 2 by RT PCR NEGATIVE NEGATIVE Final    Comment: (NOTE) SARS-CoV-2 target nucleic acids are NOT DETECTED.  The SARS-CoV-2 RNA is generally detectable in upper respiratory specimens during the acute phase of infection. The lowest concentration of SARS-CoV-2 viral copies this assay can detect is 138 copies/mL. A negative result does not preclude  SARS-Cov-2 infection and should not be used as the sole basis for treatment or other patient management decisions. A negative result may occur with  improper specimen collection/handling, submission of specimen other than nasopharyngeal swab, presence of viral mutation(s) within the areas targeted by this assay, and inadequate number of viral copies(<138 copies/mL). A negative result must be combined with clinical observations, patient history, and epidemiological information. The expected result is Negative.  Fact Sheet for Patients:   Fact Sheet for Healthcare Providers:  14/05/22  This test is no t yet approved or cleared by the [employer] FDA and  has been authorized for detection and/or diagnosis of SARS-CoV-2 by FDA under an Emergency Use Authorization (EUA). This EUA will remain  in effect (meaning this test can be used) for the duration of the COVID-19 declaration under Section 564(b)(1) of the Act, 21 U.S.C.section 360bbb-3(b)(1), unless the authorization is terminated  or revoked sooner.  Influenza A by PCR NEGATIVE NEGATIVE Final   Influenza B by PCR NEGATIVE NEGATIVE Final    Comment: (NOTE) The Xpert Xpress SARS-CoV-2/FLU/RSV plus assay is intended as an aid in the diagnosis of influenza from Nasopharyngeal swab specimens and should not be used as a sole basis for treatment. Nasal washings and aspirates are unacceptable for Xpert Xpress SARS-CoV-2/FLU/RSV testing.  Fact Sheet for Patients: [employer]gerCourse.com  Fact Sheet for Healthcare Providers: SeriousBroker.it  This test is not yet approved or cleared by the [place] FDA and has been authorized for detection and/or diagnosis of SARS-CoV-2 by FDA under an Emergency Use Authorization (EUA). This EUA will remain in effect (meaning this test can be used) for the duration of  the COVID-19 declaration under Section 564(b)(1) of the Act, 21 U.S.C. section 360bbb-3(b)(1), unless the authorization is terminated or revoked.  Performed at [facility], [address] [place], [place] [address]   Resp Panel by RT-PCR (Flu A&B, Covid) Urine, Clean Catch     Status: None   Collection Time: 03/11/21  3:37 AM   Specimen: Urine, Clean Catch; Nasopharyngeal(NP) swabs in vial transport medium  Result Value Ref Range Status   SARS Coronavirus 2 by RT PCR NEGATIVE NEGATIVE Final    Comment: (NOTE) SARS-CoV-2 target nucleic acids are NOT DETECTED.  The SARS-CoV-2 RNA is generally detectable in upper respiratory specimens during the acute phase of infection. The lowest concentration of SARS-CoV-2 viral copies this assay can detect is 138 copies/mL. A negative result does not preclude SARS-Cov-2 infection and should not be used as the sole basis for treatment or other patient management decisions. A negative result may occur with  improper specimen collection/handling, submission of specimen other than nasopharyngeal swab, presence of viral mutation(s) within the areas targeted by this assay, and inadequate number of viral copies(<138 copies/mL). A negative result must be combined with clinical observations, patient history, and epidemiological information. The expected result is Negative.  Fact Sheet for Patients:  [employer]loggerCourse[employer]com  Fact Sheet for Healthcare Providers:  SeriousBroker.it  This test is no t yet approved or cleared by the [place] FDA and  has been authorized for detection and/or diagnosis of SARS-CoV-2 by FDA under an Emergency Use Authorization (EUA). This EUA will remain  in effect (meaning this test can be used) for the duration of the COVID-19 declaration under Section 564(b)(1) of the Act, 21 U.S.C.section 360bbb-3(b)(1), unless the authorization is terminated  or revoked  sooner.       Influenza A by PCR NEGATIVE NEGATIVE Final   Influenza B by PCR NEGATIVE NEGATIVE Final    Comment: (NOTE) The Xpert Xpress SARS-CoV-2/FLU/RSV plus assay is intended as an aid in the diagnosis of influenza from Nasopharyngeal swab specimens and should not be used as a sole basis for treatment. Nasal washings and aspirates are unacceptable for Xpert Xpress SARS-CoV-2/FLU/RSV testing.  Fact Sheet for Patients: BloggerCourse.com  Fact Sheet for Healthcare Providers: SeriousBroker.it  This test is not yet approved or cleared by the [place] FDA and has been authorized for detection and/or diagnosis of SARS-CoV-2 by FDA under an Emergency Use Authorization (EUA). This EUA will remain in effect (meaning this test can be used) for the duration of the COVID-19 declaration under Section 564(b)(1) of the Act, 21 U.S.C. section 360bbb-3(b)(1), unless the authorization is terminated or revoked.  Performed at [facility], [address], [place], [place] [address]   Respiratory (~20 pathogens) panel by PCR     Status:  Abnormal   Collection Time: 03/11/21  7:59 AM   Specimen: Nasopharyngeal Swab; Respiratory  Result Value Ref Range Status   Adenovirus NOT DETECTED NOT DETECTED Final   Coronavirus 229E NOT DETECTED NOT DETECTED Final    Comment: (NOTE) The Coronavirus on the Respiratory Panel, DOES NOT test for the novel  Coronavirus (2019 nCoV)    Coronavirus HKU1 NOT DETECTED NOT DETECTED Final   Coronavirus NL63 NOT DETECTED NOT DETECTED Final   Coronavirus OC43 NOT DETECTED NOT DETECTED Final   Metapneumovirus NOT DETECTED NOT DETECTED Final   Rhinovirus / Enterovirus DETECTED (A) NOT DETECTED Final   Influenza A NOT DETECTED NOT DETECTED Final   Influenza B NOT DETECTED NOT DETECTED Final   Parainfluenza Virus 1 NOT DETECTED NOT DETECTED Final   Parainfluenza Virus 2 NOT DETECTED NOT DETECTED  Final   Parainfluenza Virus 3 NOT DETECTED NOT DETECTED Final   Parainfluenza Virus 4 NOT DETECTED NOT DETECTED Final   Respiratory Syncytial Virus NOT DETECTED NOT DETECTED Final   Bordetella pertussis NOT DETECTED NOT DETECTED Final   Bordetella Parapertussis NOT DETECTED NOT DETECTED Final   Chlamydophila pneumoniae NOT DETECTED NOT DETECTED Final   Mycoplasma pneumoniae NOT DETECTED NOT DETECTED Final    Comment: Performed at [facility], [address], [place], [place] [address]    Radiology Studies:  CT Angio Chest PE W/Cm &/Or Wo Cm  Result Date: 03/11/2021 CLINICAL DATA:  Shortness of breath EXAM: CT ANGIOGRAPHY CHEST WITH CONTRAST TECHNIQUE: Multidetector CT imaging of the chest was performed using the standard protocol during bolus administration of intravenous contrast. Multiplanar CT image reconstructions and MIPs were obtained to evaluate the vascular anatomy. CONTRAST:  OMNIPAQUE IOHEXOL 350 MG/ML SOLN COMPARISON:  None. FINDINGS: Cardiovascular: Suboptimal opacification of the pulmonary arteries to the segmental level due to motion, streak, and bolus dispersion. No evidence of pulmonary embolism. Normal heart size. No pericardial effusion. Mediastinum/Nodes: Negative for adenopathy or mass Lungs/Pleura: Generalized airway thickening with atelectasis and bronchial narrowing/collapse. There is no edema, consolidation, effusion, or pneumothorax. Upper Abdomen: No acute abnormality. Musculoskeletal: No chest wall abnormality. No acute or significant osseous findings. Review of the MIP images confirms the above findings. IMPRESSION: 1. Generalized bronchitis with airway narrowing and subsegmental atelectasis. 2. No evidence of pulmonary embolism. 3. Suboptimal CTA due to motion and bolus dispersion. Electronically Signed   By: Tiburcio Pea M.D.   On: 03/11/2021 06:34   DG Chest Portable 1 View  Result Date: 03/11/2021 CLINICAL DATA:  Cough and shortness of breath EXAM:  PORTABLE CHEST 1 VIEW COMPARISON:  03/09/2021 FINDINGS: The heart size and mediastinal contours are within normal limits. Both lungs are clear. The visualized skeletal structures are unremarkable. IMPRESSION: No active disease. Electronically Signed   By: Alcide Clever M.D.   On: 03/11/2021 03:27    Scheduled Meds:    doxycycline  100 mg Oral Q12H   enoxaparin (LOVENOX) injection  50 mg Subcutaneous Q24H   guaiFENesin  600 mg Oral BID   ipratropium-albuterol  3 mL Nebulization Q6H   melatonin  3 mg Oral QHS   methylPREDNISolone (SOLU-MEDROL) injection  60 mg Intravenous Q12H   nicotine  21 mg Transdermal Daily   pantoprazole  40 mg Oral Daily    Continuous Infusions:     LOS: 0 days     Marcellus Scott, MD,  FACP, [facility], [facility], [facility] (Care Management Physician Certified) [facility] [place]  To contact the attending provider between  7A-7P or the covering provider during after hours 7P-7A, please log into the web site [email or web address] and access using universal [place] password for that web site. If you do not have the password, please call the hospital operator.  03/12/2021, 6:14 PM

## 2021-03-13 DIAGNOSIS — J209 Acute bronchitis, unspecified: Secondary | ICD-10-CM | POA: Diagnosis not present

## 2021-03-13 DIAGNOSIS — J441 Chronic obstructive pulmonary disease with (acute) exacerbation: Secondary | ICD-10-CM | POA: Diagnosis not present

## 2021-03-13 LAB — CBC
HCT: 36.2 % (ref 36.0–46.0)
Hemoglobin: 11.3 g/dL — ABNORMAL LOW (ref 12.0–15.0)
MCH: 29.2 pg (ref 26.0–34.0)
MCHC: 31.2 g/dL (ref 30.0–36.0)
MCV: 93.5 fL (ref 80.0–100.0)
Platelets: 418 10*3/uL — ABNORMAL HIGH (ref 150–400)
RBC: 3.87 MIL/uL (ref 3.87–5.11)
RDW: 14.6 % (ref 11.5–15.5)
WBC: 18.3 10*3/uL — ABNORMAL HIGH (ref 4.0–10.5)
nRBC: 0 % (ref 0.0–0.2)

## 2021-03-13 NOTE — Clinical Social Work Note (Signed)
  Transition of Care [facility]) Screening Note   Patient Details  Name: Kallyn Demarcus Date of Birth: 1980/09/12   Transition of Care [facility]) CM/SW Contact:    Ida Rogue, LCSW Phone Number: 03/13/2021, 2:09 PM    Transition of Care Department [facility]) has reviewed patient and no TOC needs have been identified at this time. We will continue to monitor patient advancement through interdisciplinary progression rounds. If new patient transition needs arise, please place a TOC consult.

## 2021-03-13 NOTE — Progress Notes (Signed)
PROGRESS NOTE   Emonnie Cannady  ZOX:[id number]    DOB: 1980/09/04    DOA: 03/11/2021  PCP: Patient, No Pcp Per (Inactive)   I have briefly reviewed patients previous medical records in [facility].  Chief Complaint  Patient presents with   Shortness of Breath    Brief Narrative:  40 year old female, works as Lawyer, medical history significant for GERD, GAD, morbid obesity, ongoing tobacco use, presented to the ED with complaints of progressive dyspnea, productive cough and chills.  Reportedly initially she was on BiPAP, then weaned to room air, monitored for several hours and discharged home with doxycycline, prednisone and albuterol but returned with persistent and progressive symptoms.  Admitted for acute rhinovirus bronchitis complicating possible underlying COPD with exacerbation and possible OSA.   Assessment & Plan:  Principal Problem:   Dyspnea   Acute rhinovirus bronchitis complicating possible COPD (undiagnosed) with exacerbation and possible OSA (undiagnosed): -RVP positive for rhinovirus.  Influenza A&B and coronavirus 19 RT-PCR negative. -CTA chest showed generalized bronchitis with airway narrowing and subsegmental atelectasis.  No evidence of pulmonary embolism. -BNP: 65.2.  High-sensitivity troponins x2 negative. -Significant clinical bronchospasm on admission although not hypoxic on room air. -Treated with IV Solu-Medrol 60 mg every 12 hours, scheduled and as needed bronchodilator nebulizations, Mucinex, as needed antitussives and flutter valve.  Added doxycycline in case she has an element of superadded bacterial infection given productive cough. - Added Protonix in case she has pseudo wheeze related to her GERD. -Recommended outpatient PFTs and sleep study. -Tobacco cessation counseled. -Slowly improving.  Still had significant dyspnea on exertion walking from her bed to the bathroom yesterday but has not moved around much since then.  Advised gradual  mobilization.  Continue current treatment regimen.  GERD -PPI  Tobacco use disorder -Cessation counseled.  Placed nicotine patch.  GAD - Does not appear to be on any meds PTA.  Anemia - Stable.  Outpatient follow-up.  Leukocytosis - Likely due to steroids and stress response.  Body mass index is 43.4 kg/m./Morbid obesity -Has been counseled regarding lifestyle modifications and weight loss.    DVT prophylaxis:   Lovenox   Code Status: Full Code.   Family Communication: Son at bedside. Disposition:  Status is: Inpatient  DC home possibly in the next 1 to 2 days.       Consultants:   None  Procedures:   None  Antimicrobials:   Doxycycline 12/8 >   Subjective:  Patient reports that she feels better.  Dyspnea and wheezing have improved.  Yesterday she still had dyspnea on exertion with getting up from her bed to the bathroom and hence RN provided her with a bedside commode.  She has not been out of bed much since then.  No chest pain.  No cough.  Reports that she has not had much of her reflux symptoms for about a week now.  Objective:   Vitals:   03/13/21 0207 03/13/21 0525 03/13/21 0802 03/13/21 1242  BP:  (!) 149/69  105/69  Pulse:  84  83  Resp:  18  20  Temp:  98.5 F (36.9 C)  97.7 F (36.5 C)  TempSrc:  Oral  Oral  SpO2: 96% 95% 96% 97%  Weight:      Height:        General exam: Young female, moderately built, morbidly obese, lying propped up in bed without distress.  Looks much improved compared to yesterday. Respiratory system: Clear to auscultation anteriorly.  Occasional rhonchi posteriorly.  No increased work of breathing.  Able to speak in full sentences. Cardiovascular system: S1 and S2 heard, RRR.  No JVD, murmurs or pedal edema. Gastrointestinal system: Abdomen is nondistended, soft and nontender. No organomegaly or masses felt. Normal bowel sounds heard. Central nervous system: Alert and oriented. No focal neurological  deficits. Extremities: Symmetric 5 x 5 power. Skin: No rashes, lesions or ulcers Psychiatry: Judgement and insight appear normal. Mood & affect appropriate.     Data Reviewed:   I have personally reviewed following labs and imaging studies   CBC: Recent Labs  Lab 03/09/21 2302 03/09/21 2311 03/11/21 0342 03/11/21 1221 03/12/21 0700 03/13/21 0615  WBC 9.2  --  23.9* 18.3* 19.3* 18.3*  NEUTROABS 6.7  --  19.7*  --   --   --   HGB 12.9   < > 11.5* 11.1* 11.0* 11.3*  HCT 40.5   < > 35.7* 34.6* 34.0* 36.2  MCV 92.0  --  91.1 92.3 91.9 93.5  PLT 379  --  412* 371 389 418*   < > = values in this interval not displayed.    Basic Metabolic Panel: Recent Labs  Lab 03/09/21 2302 03/09/21 2311 03/11/21 0342 03/11/21 1221 03/12/21 0700  NA 138 141 138  --  137  K 3.4* 3.4* 4.7  --  3.8  CL 105 103 108  --  107  CO2 27  --  24  --  24  GLUCOSE 83 79 102*  --  124*  BUN 9 6 18   --  18  CREATININE 0.80 0.70 0.62 0.73 0.51  CALCIUM 8.9  --  8.5*  --  8.6*    Liver Function Tests: Recent Labs  Lab 03/09/21 2302 03/11/21 0342 03/12/21 0700  AST 17 32 15  ALT 22 27 25   ALKPHOS 93 74 72  BILITOT 0.7 1.1 0.4  PROT 8.2* 7.3 6.8  ALBUMIN 4.2 3.7 3.4*    CBG: No results for input(s): GLUCAP in the last 168 hours.  Microbiology Studies:   Recent Results (from the past 240 hour(s))  Resp Panel by RT-PCR (Flu A&B, Covid) Nasopharyngeal Swab     Status: None   Collection Time: 03/09/21  9:32 PM   Specimen: Nasopharyngeal Swab; Nasopharyngeal(NP) swabs in vial transport medium  Result Value Ref Range Status   SARS Coronavirus 2 by RT PCR NEGATIVE NEGATIVE Final    Comment: (NOTE) SARS-CoV-2 target nucleic acids are NOT DETECTED.  The SARS-CoV-2 RNA is generally detectable in upper respiratory specimens during the acute phase of infection. The lowest concentration of SARS-CoV-2 viral copies this assay can detect is 138 copies/mL. A negative result does not preclude  SARS-Cov-2 infection and should not be used as the sole basis for treatment or other patient management decisions. A negative result may occur with  improper specimen collection/handling, submission of specimen other than nasopharyngeal swab, presence of viral mutation(s) within the areas targeted by this assay, and inadequate number of viral copies(<138 copies/mL). A negative result must be combined with clinical observations, patient history, and epidemiological information. The expected result is Negative.  Fact Sheet for Patients:   Fact Sheet for Healthcare Providers:  14/05/22  This test is no t yet approved or cleared by the [employer] FDA and  has been authorized for detection and/or diagnosis of SARS-CoV-2 by FDA under an Emergency Use Authorization (EUA). This EUA will remain  in effect (meaning this test can be used) for the duration of the COVID-19 declaration  under Section 564(b)(1) of the Act, 21 U.S.C.section 360bbb-3(b)(1), unless the authorization is terminated  or revoked sooner.       Influenza A by PCR NEGATIVE NEGATIVE Final   Influenza B by PCR NEGATIVE NEGATIVE Final    Comment: (NOTE) The Xpert Xpress SARS-CoV-2/FLU/RSV plus assay is intended as an aid in the diagnosis of influenza from Nasopharyngeal swab specimens and should not be used as a sole basis for treatment. Nasal washings and aspirates are unacceptable for Xpert Xpress SARS-CoV-2/FLU/RSV testing.  Fact Sheet for Patients: BloggerCourse.com  Fact Sheet for Healthcare Providers: SeriousBroker.it  This test is not yet approved or cleared by the [place] FDA and has been authorized for detection and/or diagnosis of SARS-CoV-2 by FDA under an Emergency Use Authorization (EUA). This EUA will remain in effect (meaning this test can be used) for the duration of  the COVID-19 declaration under Section 564(b)(1) of the Act, 21 U.S.C. section 360bbb-3(b)(1), unless the authorization is terminated or revoked.  Performed at [facility], [address], [place], [place] [address]   Resp Panel by RT-PCR (Flu A&B, Covid) Urine, Clean Catch     Status: None   Collection Time: 03/11/21  3:37 AM   Specimen: Urine, Clean Catch; Nasopharyngeal(NP) swabs in vial transport medium  Result Value Ref Range Status   SARS Coronavirus 2 by RT PCR NEGATIVE NEGATIVE Final    Comment: (NOTE) SARS-CoV-2 target nucleic acids are NOT DETECTED.  The SARS-CoV-2 RNA is generally detectable in upper respiratory specimens during the acute phase of infection. The lowest concentration of SARS-CoV-2 viral copies this assay can detect is 138 copies/mL. A negative result does not preclude SARS-Cov-2 infection and should not be used as the sole basis for treatment or other patient management decisions. A negative result may occur with  improper specimen collection/handling, submission of specimen other than nasopharyngeal swab, presence of viral mutation(s) within the areas targeted by this assay, and inadequate number of viral copies(<138 copies/mL). A negative result must be combined with clinical observations, patient history, and epidemiological information. The expected result is Negative.  Fact Sheet for Patients:  BloggerCourse.com  Fact Sheet for Healthcare Providers:  SeriousBroker.it  This test is no t yet approved or cleared by the [place] FDA and  has been authorized for detection and/or diagnosis of SARS-CoV-2 by FDA under an Emergency Use Authorization (EUA). This EUA will remain  in effect (meaning this test can be used) for the duration of the COVID-19 declaration under Section 564(b)(1) of the Act, 21 U.S.C.section 360bbb-3(b)(1), unless the authorization is terminated  or revoked  sooner.       Influenza A by PCR NEGATIVE NEGATIVE Final   Influenza B by PCR NEGATIVE NEGATIVE Final    Comment: (NOTE) The Xpert Xpress SARS-CoV-2/FLU/RSV plus assay is intended as an aid in the diagnosis of influenza from Nasopharyngeal swab specimens and should not be used as a sole basis for treatment. Nasal washings and aspirates are unacceptable for Xpert Xpress SARS-CoV-2/FLU/RSV testing.  Fact Sheet for Patients: BloggerCourse.com  Fact Sheet for Healthcare Providers: SeriousBroker.it  This test is not yet approved or cleared by the [place] FDA and has been authorized for detection and/or diagnosis of SARS-CoV-2 by FDA under an Emergency Use Authorization (EUA). This EUA will remain in effect (meaning this test can be used) for the duration of the COVID-19 declaration under Section 564(b)(1) of the Act, 21 U.S.C. section 360bbb-3(b)(1), unless the authorization is terminated or revoked.  Performed at  [facility], [address], [place], [place] [address]   Respiratory (~20 pathogens) panel by PCR     Status: Abnormal   Collection Time: 03/11/21  7:59 AM   Specimen: Nasopharyngeal Swab; Respiratory  Result Value Ref Range Status   Adenovirus NOT DETECTED NOT DETECTED Final   Coronavirus 229E NOT DETECTED NOT DETECTED Final    Comment: (NOTE) The Coronavirus on the Respiratory Panel, DOES NOT test for the novel  Coronavirus (2019 nCoV)    Coronavirus HKU1 NOT DETECTED NOT DETECTED Final   Coronavirus NL63 NOT DETECTED NOT DETECTED Final   Coronavirus OC43 NOT DETECTED NOT DETECTED Final   Metapneumovirus NOT DETECTED NOT DETECTED Final   Rhinovirus / Enterovirus DETECTED (A) NOT DETECTED Final   Influenza A NOT DETECTED NOT DETECTED Final   Influenza B NOT DETECTED NOT DETECTED Final   Parainfluenza Virus 1 NOT DETECTED NOT DETECTED Final   Parainfluenza Virus 2 NOT DETECTED NOT DETECTED  Final   Parainfluenza Virus 3 NOT DETECTED NOT DETECTED Final   Parainfluenza Virus 4 NOT DETECTED NOT DETECTED Final   Respiratory Syncytial Virus NOT DETECTED NOT DETECTED Final   Bordetella pertussis NOT DETECTED NOT DETECTED Final   Bordetella Parapertussis NOT DETECTED NOT DETECTED Final   Chlamydophila pneumoniae NOT DETECTED NOT DETECTED Final   Mycoplasma pneumoniae NOT DETECTED NOT DETECTED Final    Comment: Performed at [facility], [address], [place], [place] [address]    Radiology Studies:  No results found.  Scheduled Meds:    doxycycline  100 mg Oral Q12H   enoxaparin (LOVENOX) injection  50 mg Subcutaneous Q24H   guaiFENesin  600 mg Oral BID   ipratropium-albuterol  3 mL Nebulization Q6H   methylPREDNISolone (SOLU-MEDROL) injection  60 mg Intravenous Q12H   nicotine  21 mg Transdermal Daily   pantoprazole  40 mg Oral Daily    Continuous Infusions:     LOS: 1 day     Marcellus Scott, MD,  FACP, [facility], [facility], [facility] (Care Management Physician Certified) [facility] [place]  To contact the attending provider between 7A-7P or the covering provider during after hours 7P-7A, please log into the web site [email or web address] and access using universal [place] password for that web site. If you do not have the password, please call the hospital operator.  03/13/2021, 3:42 PM

## 2021-03-13 NOTE — Plan of Care (Signed)

## 2021-03-14 DIAGNOSIS — J206 Acute bronchitis due to rhinovirus: Secondary | ICD-10-CM | POA: Diagnosis not present

## 2021-03-14 NOTE — Progress Notes (Signed)
PROGRESS NOTE  Christina Rosales KDT:[id number] DOB: 1980-06-28 DOA: 03/11/2021 PCP: Patient, No Pcp Per (Inactive)  HPI/Recap of past 42 hours: 40 year old female with a history of GERD queen artery disease morbid obesity ongoing tobacco use who came to the emergency room for evaluation of progressive dyspnea productive cough and chills.  She was diagnosed with rhinovirus bronchitis She has been on IV steroid and guaifenesin  Subjective: Patient seen and examined at bedside She is complaining that her oxygen drops to the 194's whenever she gets up to go to the bathroom  Assessment/Plan: Principal Problem:   Dyspnea   1.  Acute rhinovirus bronchitis complicating possible COPD Her RSV was positive influenza and coronavirus were negative Patient was hypoxic on room air she has been treated with Solu-Medrol Mucinex and bronchodilator nebulizers Patient still has hypoxia on exertion mild flight getting up from her bed to the bathroom  2.  Leukocytosis in the setting of acute rhinovirus bronchitis this might have some bacterial superimposition it could be from steroid use On on doxycycline  3.  Anemia Stable Continue follow-up as outpatient  4.  GERD Continue PPI  5.  Tobacco abuse Patient is on nicotine patch.  She had been previously counseled on smoking cessation  6.  Morbid obesity Continue outpatient follow-up for lifestyle modification and weight loss  Code Status: Full  Severity of Illness: The appropriate patient status for this patient is INPATIENT. Inpatient status is judged to be reasonable and necessary in order to provide the required intensity of service to ensure the patient's safety. The patient's presenting symptoms, physical exam findings, and initial radiographic and laboratory data in the context of their chronic comorbidities is felt to place them at high risk for further clinical deterioration. Furthermore, it is not anticipated that the patient will be  medically stable for discharge from the hospital within 2 midnights of admission.   * I certify that at the point of admission it is my clinical judgment that the patient will require inpatient hospital care spanning beyond 2 midnights from the point of admission due to high intensity of service, high risk for further deterioration and high frequency of surveillance required.*   Family Communication: None at bedside  Disposition Plan: Home in 1 to 2 days Status is: Inpatient   Dispo: The patient is from: Home              Anticipated d/c is to:               Anticipated d/c date is:               Patient currently not medically stable for discharge  Consultants: None  Procedures: None   Antimicrobials: Doxycycline  DVT prophylaxis: Lovenox   Objective: Vitals:   03/13/21 2028 03/14/21 0206 03/14/21 0403 03/14/21 0757  BP:   (!) 145/90   Pulse:   75   Resp:   20   Temp:   97.9 F (36.6 C)   TempSrc:   Oral   SpO2: 98% 98% 93% 94%  Weight:      Height:        Intake/Output Summary (Last 24 hours) at 03/14/2021 1119 Last data filed at 03/13/2021 1800 Gross per 24 hour  Intake 480 ml  Output --  Net 480 ml   Filed Weights   03/11/21 1344  Weight: 111.1 kg   Body mass index is 43.4 kg/m.  Exam:  General: 40 y.o. year-old female well developed well nourished obese  in no acute distress.  Alert and oriented x3. Cardiovascular: Regular rate and rhythm with no rubs or gallops.  No thyromegaly or JVD noted.   Respiratory: Clear to auscultation with no wheezes or rales. Good inspiratory effort. Abdomen: Soft nontender nondistended with normal bowel sounds x4 quadrants. Musculoskeletal: No lower extremity edema. 2/4 pulses in all 4 extremities. Skin: No ulcerative lesions noted or rashes, Psychiatry: Mood is appropriate for condition and setting Neurology:    Data Reviewed: CBC: Recent Labs  Lab 03/09/21 2302 03/09/21 2311 03/11/21 0342 03/11/21 1221  03/12/21 0700 03/13/21 0615  WBC 9.2  --  23.9* 18.3* 19.3* 18.3*  NEUTROABS 6.7  --  19.7*  --   --   --   HGB 12.9 13.9 11.5* 11.1* 11.0* 11.3*  HCT 40.5 41.0 35.7* 34.6* 34.0* 36.2  MCV 92.0  --  91.1 92.3 91.9 93.5  PLT 379  --  412* 371 389 418*   Basic Metabolic Panel: Recent Labs  Lab 03/09/21 2302 03/09/21 2311 03/11/21 0342 03/11/21 1221 03/12/21 0700  NA 138 141 138  --  137  K 3.4* 3.4* 4.7  --  3.8  CL 105 103 108  --  107  CO2 27  --  24  --  24  GLUCOSE 83 79 102*  --  124*  BUN --  18  CREATININE 0.80 0.70 0.62 0.73 0.51  CALCIUM 8.9  --  8.5*  --  8.6*   GFR: Estimated Creatinine Clearance: 112 mL/min (by C-G formula based on SCr of 0.51 mg/dL). Liver Function Tests: Recent Labs  Lab 03/09/21 2302 03/11/21 0342 03/12/21 0700  AST 17 32 15  ALT ALKPHOS 93 74 72  BILITOT 0.7 1.1 0.4  PROT 8.2* 7.3 6.8  ALBUMIN 4.2 3.7 3.4*   No results for input(s): LIPASE, AMYLASE in the last 168 hours. No results for input(s): AMMONIA in the last 168 hours. Coagulation Profile: No results for input(s): INR, PROTIME in the last 168 hours. Cardiac Enzymes: No results for input(s): CKTOTAL, CKMB, CKMBINDEX, TROPONINI in the last 168 hours. BNP (last 3 results) No results for input(s): PROBNP in the last 8760 hours. HbA1C: No results for input(s): HGBA1C in the last 72 hours. CBG: No results for input(s): GLUCAP in the last 168 hours. Lipid Profile: No results for input(s): CHOL, HDL, LDLCALC, TRIG, CHOLHDL, LDLDIRECT in the last 72 hours. Thyroid Function Tests: No results for input(s): TSH, T4TOTAL, FREET4, T3FREE, THYROIDAB in the last 72 hours. Anemia Panel: No results for input(s): VITAMINB12, FOLATE, FERRITIN, TIBC, IRON, RETICCTPCT in the last 72 hours. Urine analysis:    Component Value Date/Time   COLORURINE YELLOW 03/11/2021 0337   APPEARANCEUR CLEAR 03/11/2021 0337   LABSPEC 1.032 (H) 03/11/2021 0337   PHURINE 5.0 03/11/2021  0337   GLUCOSEU NEGATIVE 03/11/2021 0337   HGBUR SMALL (A) 03/11/2021 0337   BILIRUBINUR NEGATIVE 03/11/2021 0337   KETONESUR 5 (A) 03/11/2021 0337   PROTEINUR 30 (A) 03/11/2021 0337   NITRITE NEGATIVE 03/11/2021 0337   LEUKOCYTESUR NEGATIVE 03/11/2021 0337   Sepsis Labs: (procalcitonin:4,lacticidven:4)  ) Recent Results (from the past 240 hour(s))  Resp Panel by RT-PCR (Flu A&B, Covid) Nasopharyngeal Swab     Status: None   Collection Time: 03/09/21  9:32 PM   Specimen: Nasopharyngeal Swab; Nasopharyngeal(NP) swabs in vial transport medium  Result Value Ref Range Status   SARS Coronavirus 2 by RT PCR NEGATIVE NEGATIVE Final  Comment: (NOTE) SARS-CoV-2 target nucleic acids are NOT DETECTED.  The SARS-CoV-2 RNA is generally detectable in upper respiratory specimens during the acute phase of infection. The lowest concentration of SARS-CoV-2 viral copies this assay can detect is 138 copies/mL. A negative result does not preclude SARS-Cov-2 infection and should not be used as the sole basis for treatment or other patient management decisions. A negative result may occur with  improper specimen collection/handling, submission of specimen other than nasopharyngeal swab, presence of viral mutation(s) within the areas targeted by this assay, and inadequate number of viral copies(<138 copies/mL). A negative result must be combined with clinical observations, patient history, and epidemiological information. The expected result is Negative.  Fact Sheet for Patients:  BloggerCourse.com  Fact Sheet for Healthcare Providers:  SeriousBroker.it  This test is no t yet approved or cleared by the [place] FDA and  has been authorized for detection and/or diagnosis of SARS-CoV-2 by FDA under an Emergency Use Authorization (EUA). This EUA will remain  in effect (meaning this test can be used) for the duration of the COVID-19  declaration under Section 564(b)(1) of the Act, 21 U.S.C.section 360bbb-3(b)(1), unless the authorization is terminated  or revoked sooner.       Influenza A by PCR NEGATIVE NEGATIVE Final   Influenza B by PCR NEGATIVE NEGATIVE Final    Comment: (NOTE) The Xpert Xpress SARS-CoV-2/FLU/RSV plus assay is intended as an aid in the diagnosis of influenza from Nasopharyngeal swab specimens and should not be used as a sole basis for treatment. Nasal washings and aspirates are unacceptable for Xpert Xpress SARS-CoV-2/FLU/RSV testing.  Fact Sheet for Patients: BloggerCourse.com  Fact Sheet for Healthcare Providers: SeriousBroker.it  This test is not yet approved or cleared by the [place] FDA and has been authorized for detection and/or diagnosis of SARS-CoV-2 by FDA under an Emergency Use Authorization (EUA). This EUA will remain in effect (meaning this test can be used) for the duration of the COVID-19 declaration under Section 564(b)(1) of the Act, 21 U.S.C. section 360bbb-3(b)(1), unless the authorization is terminated or revoked.  Performed at [facility], [address], [place], [place] [address]   Resp Panel by RT-PCR (Flu A&B, Covid) Urine, Clean Catch     Status: None   Collection Time: 03/11/21  3:37 AM   Specimen: Urine, Clean Catch; Nasopharyngeal(NP) swabs in vial transport medium  Result Value Ref Range Status   SARS Coronavirus 2 by RT PCR NEGATIVE NEGATIVE Final    Comment: (NOTE) SARS-CoV-2 target nucleic acids are NOT DETECTED.  The SARS-CoV-2 RNA is generally detectable in upper respiratory specimens during the acute phase of infection. The lowest concentration of SARS-CoV-2 viral copies this assay can detect is 138 copies/mL. A negative result does not preclude SARS-Cov-2 infection and should not be used as the sole basis for treatment or other patient management decisions. A negative  result may occur with  improper specimen collection/handling, submission of specimen other than nasopharyngeal swab, presence of viral mutation(s) within the areas targeted by this assay, and inadequate number of viral copies(<138 copies/mL). A negative result must be combined with clinical observations, patient history, and epidemiological information. The expected result is Negative.  Fact Sheet for Patients:  BloggerCourse.com  Fact Sheet for Healthcare Providers:  SeriousBroker.it  This test is no t yet approved or cleared by the [place] FDA and  has been authorized for detection and/or diagnosis of SARS-CoV-2 by FDA under an Emergency Use Authorization (EUA). This EUA will remain  in effect (meaning this test can be used) for the duration of the COVID-19 declaration under Section 564(b)(1) of the Act, 21 U.S.C.section 360bbb-3(b)(1), unless the authorization is terminated  or revoked sooner.       Influenza A by PCR NEGATIVE NEGATIVE Final   Influenza B by PCR NEGATIVE NEGATIVE Final    Comment: (NOTE) The Xpert Xpress SARS-CoV-2/FLU/RSV plus assay is intended as an aid in the diagnosis of influenza from Nasopharyngeal swab specimens and should not be used as a sole basis for treatment. Nasal washings and aspirates are unacceptable for Xpert Xpress SARS-CoV-2/FLU/RSV testing.  Fact Sheet for Patients: BloggerCourse.com  Fact Sheet for Healthcare Providers: SeriousBroker.it  This test is not yet approved or cleared by the [place] FDA and has been authorized for detection and/or diagnosis of SARS-CoV-2 by FDA under an Emergency Use Authorization (EUA). This EUA will remain in effect (meaning this test can be used) for the duration of the COVID-19 declaration under Section 564(b)(1) of the Act, 21 U.S.C. section 360bbb-3(b)(1), unless the authorization is  terminated or revoked.  Performed at [facility], [address], [place], [place] [address]   Respiratory (~20 pathogens) panel by PCR     Status: Abnormal   Collection Time: 03/11/21  7:59 AM   Specimen: Nasopharyngeal Swab; Respiratory  Result Value Ref Range Status   Adenovirus NOT DETECTED NOT DETECTED Final   Coronavirus 229E NOT DETECTED NOT DETECTED Final    Comment: (NOTE) The Coronavirus on the Respiratory Panel, DOES NOT test for the novel  Coronavirus (2019 nCoV)    Coronavirus HKU1 NOT DETECTED NOT DETECTED Final   Coronavirus NL63 NOT DETECTED NOT DETECTED Final   Coronavirus OC43 NOT DETECTED NOT DETECTED Final   Metapneumovirus NOT DETECTED NOT DETECTED Final   Rhinovirus / Enterovirus DETECTED (A) NOT DETECTED Final   Influenza A NOT DETECTED NOT DETECTED Final   Influenza B NOT DETECTED NOT DETECTED Final   Parainfluenza Virus 1 NOT DETECTED NOT DETECTED Final   Parainfluenza Virus 2 NOT DETECTED NOT DETECTED Final   Parainfluenza Virus 3 NOT DETECTED NOT DETECTED Final   Parainfluenza Virus 4 NOT DETECTED NOT DETECTED Final   Respiratory Syncytial Virus NOT DETECTED NOT DETECTED Final   Bordetella pertussis NOT DETECTED NOT DETECTED Final   Bordetella Parapertussis NOT DETECTED NOT DETECTED Final   Chlamydophila pneumoniae NOT DETECTED NOT DETECTED Final   Mycoplasma pneumoniae NOT DETECTED NOT DETECTED Final    Comment: Performed at [facility], [address] [place], [place] [address]      Studies: No results found.  Scheduled Meds:  doxycycline  100 mg Oral Q12H   enoxaparin (LOVENOX) injection  50 mg Subcutaneous Q24H   guaiFENesin  600 mg Oral BID   ipratropium-albuterol  3 mL Nebulization Q6H   methylPREDNISolone (SOLU-MEDROL) injection  60 mg Intravenous Q12H   nicotine  21 mg Transdermal Daily   pantoprazole  40 mg Oral Daily    Continuous Infusions:   LOS: 2 days     Myrtie Neither, MD [facility]  To reach me or the doctor on call, go to: [email or web address] Password [email or web address]  03/14/2021, 11:19 AM

## 2021-03-15 DIAGNOSIS — J206 Acute bronchitis due to rhinovirus: Secondary | ICD-10-CM

## 2021-03-15 DIAGNOSIS — F17210 Nicotine dependence, cigarettes, uncomplicated: Secondary | ICD-10-CM

## 2021-03-15 MED ORDER — FLUCONAZOLE 150 MG PO TABS: 150.0000 mg | ORAL_TABLET | Freq: Once | ORAL | Status: DC

## 2021-03-15 MED ORDER — NICOTINE 21 MG/24HR TD PT24: 21.0000 mg | MEDICATED_PATCH | Freq: Every day | TRANSDERMAL | 0 refills | Status: AC

## 2021-03-15 NOTE — Progress Notes (Signed)
Patient will be discharging this afternoon. Belongings were returned. Education on medication will be done.

## 2021-03-15 NOTE — Discharge Summary (Signed)
Physician Discharge Summary   Patient name: Christina Rosales  Admit date:     03/11/2021  Discharge date: 03/15/2021  Discharge Physician: Brendia Sacks   PCP: Patient, No Pcp Per (Inactive)   Recommendations at discharge:  Consideration could be given to outpatient PFT and sleep study.  Recommend stop smoking.  Discharge Diagnoses Principal Problem:   Acute bronchitis due to Rhinovirus Active Problems:   Cigarette smoker   Dyspnea  Hospital Course   40 year old woman presented with progressive shortness of breath, cough, chills.  Seen twice in the emergency department, initially discharged on doxycycline and prednisone but returned with worsening symptoms. --Admitted for acute bronchitis secondary to rhinovirus, possibly superimposed on undiagnosed COPD.  Responded gradually to standard therapy including steroids, antibiotics and discharged home in good condition.  Consideration could be given to outpatient PFT and sleep study.  Recommend stop smoking.  * Acute bronchitis due to Rhinovirus -- Responded gradually to standard therapy with steroids, bronchodilators, antibiotic.  Complete antibiotic and steroid as an outpatient.  Cigarette smoker -- Recommend cessation.   Procedures performed: none   Condition at discharge: good  Feels better, breathing better, ambulating better. Exam Physical Exam Vitals reviewed.  Constitutional:      General: She is not in acute distress.    Appearance: She is not ill-appearing.  Cardiovascular:     Rate and Rhythm: Normal rate and regular rhythm.     Heart sounds: No murmur heard. Pulmonary:     Effort: Pulmonary effort is normal. No respiratory distress.     Breath sounds: Wheezing present. No rales.  Musculoskeletal:     Right lower leg: No edema.     Left lower leg: No edema.  Neurological:     Mental Status: She is alert.  Psychiatric:        Mood and Affect: Mood normal.        Behavior: Behavior normal.    Disposition:  Home  Discharge time: greater than 30 minutes.   Allergies as of 03/15/2021       Reactions   Kiwi Extract    Percocet [oxycodone-acetaminophen]         Medication List     STOP taking these medications    ALPRAZolam 0.5 MG tablet Commonly known as: XANAX   ibuprofen 200 MG tablet Commonly known as: ADVIL   sertraline 50 MG tablet Commonly known as: ZOLOFT       TAKE these medications    albuterol 108 (90 Base) MCG/ACT inhaler Commonly known as: VENTOLIN HFA Inhale 2 puffs into the lungs every 4 (four) hours as needed for wheezing or shortness of breath.   BIOFREEZE EX Apply 1 application topically as needed (pain).   doxycycline 100 MG capsule Commonly known as: VIBRAMYCIN Take 1 capsule (100 mg total) by mouth 2 (two) times daily.   guaiFENesin 600 MG 12 hr tablet Commonly known as: MUCINEX Take 600 mg by mouth 2 (two) times daily as needed for cough.   nicotine 21 mg/24hr patch Commonly known as: NICODERM CQ - dosed in mg/24 hours Place 1 patch (21 mg total) onto the skin daily.   predniSONE 20 MG tablet Commonly known as: DELTASONE Take 2 tablets (40 mg total) by mouth daily with breakfast.        CT Angio Chest PE W/Cm &/Or Wo Cm  Result Date: 03/11/2021 CLINICAL DATA:  Shortness of breath EXAM: CT ANGIOGRAPHY CHEST WITH CONTRAST TECHNIQUE: Multidetector CT imaging of the chest was performed using the standard  protocol during bolus administration of intravenous contrast. Multiplanar CT image reconstructions and MIPs were obtained to evaluate the vascular anatomy. CONTRAST:  OMNIPAQUE IOHEXOL 350 MG/ML SOLN COMPARISON:  None. FINDINGS: Cardiovascular: Suboptimal opacification of the pulmonary arteries to the segmental level due to motion, streak, and bolus dispersion. No evidence of pulmonary embolism. Normal heart size. No pericardial effusion. Mediastinum/Nodes: Negative for adenopathy or mass Lungs/Pleura: Generalized airway thickening with  atelectasis and bronchial narrowing/collapse. There is no edema, consolidation, effusion, or pneumothorax. Upper Abdomen: No acute abnormality. Musculoskeletal: No chest wall abnormality. No acute or significant osseous findings. Review of the MIP images confirms the above findings. IMPRESSION: 1. Generalized bronchitis with airway narrowing and subsegmental atelectasis. 2. No evidence of pulmonary embolism. 3. Suboptimal CTA due to motion and bolus dispersion. Electronically Signed   By: Tiburcio Pea M.D.   On: 03/11/2021 06:34   DG Chest Portable 1 View  Result Date: 03/11/2021 CLINICAL DATA:  Cough and shortness of breath EXAM: PORTABLE CHEST 1 VIEW COMPARISON:  03/09/2021 FINDINGS: The heart size and mediastinal contours are within normal limits. Both lungs are clear. The visualized skeletal structures are unremarkable. IMPRESSION: No active disease. Electronically Signed   By: Alcide Clever M.D.   On: 03/11/2021 03:27   DG Chest Port 1 View  Result Date: 03/09/2021 CLINICAL DATA:  Shortness of breath. EXAM: PORTABLE CHEST 1 VIEW COMPARISON:  Chest radiograph dated 11/20/2020. FINDINGS: The heart size and mediastinal contours are within normal limits. Both lungs are clear. The visualized skeletal structures are unremarkable. IMPRESSION: No active disease. Electronically Signed   By: Elgie Collard M.D.   On: 03/09/2021 22:02   Results for orders placed or performed during the hospital encounter of 03/11/21  Resp Panel by RT-PCR (Flu A&B, Covid) Urine, Clean Catch     Status: None   Collection Time: 03/11/21  3:37 AM   Specimen: Urine, Clean Catch; Nasopharyngeal(NP) swabs in vial transport medium  Result Value Ref Range Status   SARS Coronavirus 2 by RT PCR NEGATIVE NEGATIVE Final    Comment: (NOTE) SARS-CoV-2 target nucleic acids are NOT DETECTED.  The SARS-CoV-2 RNA is generally detectable in upper respiratory specimens during the acute phase of infection. The lowest concentration of  SARS-CoV-2 viral copies this assay can detect is 138 copies/mL. A negative result does not preclude SARS-Cov-2 infection and should not be used as the sole basis for treatment or other patient management decisions. A negative result may occur with  improper specimen collection/handling, submission of specimen other than nasopharyngeal swab, presence of viral mutation(s) within the areas targeted by this assay, and inadequate number of viral copies(<138 copies/mL). A negative result must be combined with clinical observations, patient history, and epidemiological information. The expected result is Negative.  Fact Sheet for Patients:  BloggerCourse.com  Fact Sheet for Healthcare Providers:  SeriousBroker.it  This test is no t yet approved or cleared by the [place] FDA and  has been authorized for detection and/or diagnosis of SARS-CoV-2 by FDA under an Emergency Use Authorization (EUA). This EUA will remain  in effect (meaning this test can be used) for the duration of the COVID-19 declaration under Section 564(b)(1) of the Act, 21 U.S.C.section 360bbb-3(b)(1), unless the authorization is terminated  or revoked sooner.       Influenza A by PCR NEGATIVE NEGATIVE Final   Influenza B by PCR NEGATIVE NEGATIVE Final    Comment: (NOTE) The Xpert Xpress SARS-CoV-2/FLU/RSV plus assay is intended as an aid in the diagnosis  of influenza from Nasopharyngeal swab specimens and should not be used as a sole basis for treatment. Nasal washings and aspirates are unacceptable for Xpert Xpress SARS-CoV-2/FLU/RSV testing.  Fact Sheet for Patients: BloggerCourse.com  Fact Sheet for Healthcare Providers: SeriousBroker.it  This test is not yet approved or cleared by the [place] FDA and has been authorized for detection and/or diagnosis of SARS-CoV-2 by FDA under an Emergency Use  Authorization (EUA). This EUA will remain in effect (meaning this test can be used) for the duration of the COVID-19 declaration under Section 564(b)(1) of the Act, 21 U.S.C. section 360bbb-3(b)(1), unless the authorization is terminated or revoked.  Performed at [facility], [address], [place], [place] [address]   Respiratory (~20 pathogens) panel by PCR     Status: Abnormal   Collection Time: 03/11/21  7:59 AM   Specimen: Nasopharyngeal Swab; Respiratory  Result Value Ref Range Status   Adenovirus NOT DETECTED NOT DETECTED Final   Coronavirus 229E NOT DETECTED NOT DETECTED Final    Comment: (NOTE) The Coronavirus on the Respiratory Panel, DOES NOT test for the novel  Coronavirus (2019 nCoV)    Coronavirus HKU1 NOT DETECTED NOT DETECTED Final   Coronavirus NL63 NOT DETECTED NOT DETECTED Final   Coronavirus OC43 NOT DETECTED NOT DETECTED Final   Metapneumovirus NOT DETECTED NOT DETECTED Final   Rhinovirus / Enterovirus DETECTED (A) NOT DETECTED Final   Influenza A NOT DETECTED NOT DETECTED Final   Influenza B NOT DETECTED NOT DETECTED Final   Parainfluenza Virus 1 NOT DETECTED NOT DETECTED Final   Parainfluenza Virus 2 NOT DETECTED NOT DETECTED Final   Parainfluenza Virus 3 NOT DETECTED NOT DETECTED Final   Parainfluenza Virus 4 NOT DETECTED NOT DETECTED Final   Respiratory Syncytial Virus NOT DETECTED NOT DETECTED Final   Bordetella pertussis NOT DETECTED NOT DETECTED Final   Bordetella Parapertussis NOT DETECTED NOT DETECTED Final   Chlamydophila pneumoniae NOT DETECTED NOT DETECTED Final   Mycoplasma pneumoniae NOT DETECTED NOT DETECTED Final    Comment: Performed at [facility], [address] [place], [place] [address]    Signed:  Brendia Sacks MD.  [facility] 03/15/2021, 5:36 PM

## 2021-03-15 NOTE — Assessment & Plan Note (Signed)
--   Responded gradually to standard therapy with steroids, bronchodilators, antibiotic.  Complete antibiotic and steroid as an outpatient.

## 2021-03-15 NOTE — Hospital Course (Addendum)
40 year old woman presented with progressive shortness of breath, cough, chills.  Seen twice in the emergency department, initially discharged on doxycycline and prednisone but returned with worsening symptoms. --Admitted for acute bronchitis secondary to rhinovirus, possibly superimposed on undiagnosed COPD.  Responded gradually to standard therapy including steroids, antibiotics and discharged home in good condition.  Consideration could be given to outpatient PFT and sleep study.  Recommend stop smoking.

## 2021-03-15 NOTE — Assessment & Plan Note (Signed)
Recommend cessation. ?

## 2021-03-16 ENCOUNTER — Telehealth: Payer: Self-pay

## 2021-03-16 DIAGNOSIS — Z9189 Other specified personal risk factors, not elsewhere classified: Secondary | ICD-10-CM

## 2021-03-16 NOTE — Telephone Encounter (Signed)
Transition Care Management Follow-up Telephone Call Date of discharge and from where: 03/15/2021-[place]  How have you been since you were released from the hospital? Patient stated she is coming along and wants a referral for assistance with finding a PCP. Any questions or concerns? No  Items Reviewed: Did the pt receive and understand the discharge instructions provided? Yes  Medications obtained and verified? Yes  Other? No  Any new allergies since your discharge? No  Dietary orders reviewed? No Do you have support at home? Yes   Home Care and Equipment/Supplies: Were home health services ordered? not applicable If so, what is the name of the agency? N/A  Has the agency set up a time to come to the patient's home? not applicable Were any new equipment or medical supplies ordered?  No What is the name of the medical supply agency? N/A Were you able to get the supplies/equipment? not applicable Do you have any questions related to the use of the equipment or supplies? No  Functional Questionnaire: (I = Independent and D = Dependent) ADLs: I  Bathing/Dressing- I  Meal Prep- I  Eating- I  Maintaining continence- I  Transferring/Ambulation- I  Managing Meds- I  Follow up appointments reviewed:  PCP Hospital f/u appt confirmed? No   Specialist Hospital f/u appt confirmed? No   Are transportation arrangements needed? No  If their condition worsens, is the pt aware to call PCP or go to the Emergency Dept.? Yes Was the patient provided with contact information for the PCP's office or ED? Yes Was to pt encouraged to call back with questions or concerns? Yes

## 2021-04-01 ENCOUNTER — Other Ambulatory Visit: Payer: Self-pay

## 2021-04-01 NOTE — Patient Instructions (Signed)
Visit Information  Ms. Halle was given information about Medicaid Managed Care team care coordination services as a part of their Healthy [facility] Medicaid benefit. Caitlynn Ju did not consent to engagement with the [facility] Managed Care team.   If you are experiencing a medical emergency, please call 911 or report to your local emergency department or urgent care.   If you have a non-emergency medical problem during routine business hours, please contact your provider's office and ask to speak with a nurse.   For questions related to your Healthy [facility] health plan, please call: [phone] or visit the homepage here: [email or web address]  If you would like to schedule transportation through your Healthy [facility] plan, please call the following number at least 2 days in advance of your appointment: [phone]  Call the [facility] Crisis Line at [phone], at any time, 24 hours a day, 7 days a week. If you are in danger or need immediate medical attention call 911.  If you would like help to quit smoking, call 1-800-QUIT-NOW (231 600 3708) OR Espaol: 1-855-Djelo-Ya (5-379-432-7614) o para ms informacin haga clic aqu or Text READY to 709-295 to register via text  Ms. Roxan Hockey - following are the goals we discussed in your visit today:   Goals Addressed   None      The  Patient                                              has been provided with contact information for the Managed Medicaid care management team and has been advised to call with any health related questions or concerns.   Gus Puma, BSW, [facility]   [place]  High Risk Managed Medicaid Team  [phone]   Following is a copy of your plan of care:  There are no care plans that you recently modified to display for this patient.

## 2021-04-01 NOTE — Patient Outreach (Signed)
Medicaid Managed Care Social Work Note  04/01/2021 Name:  Christina Rosales MRN:  [id number] DOB:  09-02-1980  Christina Rosales is an 39 y.o. year old female who is a primary patient of Patient, No Pcp Per (Inactive).  The [facility] Managed Care Coordination team was consulted for assistance with:   PCP  Ms. Armato was given information about Medicaid Managed Care Coordination team services today. Barron Alvine Patient agreed to services and verbal consent obtained.  Engaged with patient  for by telephone forinitial visit in response to referral for case management and/or care coordination services.   Assessments/Interventions:  Review of past medical history, allergies, medications, health status, including review of consultants reports, laboratory and other test data, was performed as part of comprehensive evaluation and provision of chronic care management services.  SDOH: (Social Determinant of Health) assessments and interventions performed: BSW received a referral for patient for a PCP. BSW contacted and spoke with patient. Patient stated she was at work and asked if information can be emailed to [email or web address]. BSW emailed PCP information. No other resources are needed at this time.   Advanced Directives Status:  Not addressed in this encounter.  Care Plan                 Allergies  Allergen Reactions   Kiwi Extract    Percocet [Oxycodone-Acetaminophen]     Medications Reviewed Today     Reviewed by Porfirio Mylar, CPhT (Pharmacy Technician) on 03/11/21 at 1028  Med List Status: Complete   Medication Order Taking? Sig Documenting Provider Last Dose Status Informant  albuterol (VENTOLIN HFA) 108 (90 Base) MCG/ACT inhaler [id number] Yes Inhale 2 puffs into the lungs every 4 (four) hours as needed for wheezing or shortness of breath. Gilda Crease, MD 03/10/2021 Active Self  ALPRAZolam Prudy Feeler) 0.5 MG tablet [id number] No Take 1 tablet (0.5 mg total) by mouth  2 (two) times daily as needed for anxiety.  Patient not taking: Reported on 03/11/2021   Donnetta Hutching, MD Not Taking Active Self  doxycycline (VIBRAMYCIN) 100 MG capsule [id number] Yes Take 1 capsule (100 mg total) by mouth 2 (two) times daily. Gilda Crease, MD 03/10/2021 Active Self           Med Note Worthy Rancher, CHASIE F   Wed Mar 11, 2021 10:27 AM) Started 7 day course on 03/10/2021  guaiFENesin (MUCINEX) 600 MG 12 hr tablet [id number] Yes Take 600 mg by mouth 2 (two) times daily as needed for cough. [provider] Past Week Active Self  ibuprofen (ADVIL) 200 MG tablet [id number] Yes Take 800-1,000 mg by mouth every 6 (six) hours as needed for fever, headache or mild pain. [provider] Past Week Active Self  Menthol, Topical Analgesic, (BIOFREEZE EX) [id number] Yes Apply 1 application topically as needed (pain). [provider] Past Week Active Self  predniSONE (DELTASONE) 20 MG tablet [id number] Yes Take 2 tablets (40 mg total) by mouth daily with breakfast. Gilda Crease, MD 03/10/2021 Active Self           Med Note Worthy Rancher, CHASIE F   Wed Mar 11, 2021 10:28 AM) Started 5 day course on 03/10/2021  sertraline (ZOLOFT) 50 MG tablet [id number] No TAKE 1 TABLET(50 MG) BY MOUTH DAILY  Patient not taking: Reported on 03/11/2021   Anders Simmonds, PA-C Not Taking Active Self            Patient Active Problem List   Diagnosis Date Noted   Acute bronchitis due  to Rhinovirus 03/15/2021   Cigarette smoker 03/15/2021   Acute bronchitis    COPD with acute exacerbation [facility])    Dyspnea 03/11/2021   Anxiety 05/24/2019    Conditions to be addressed/monitored per PCP order:   PCP  There are no care plans that you recently modified to display for this patient.   Follow up:  Patient requests no follow-up at this time.  Plan: The  Patient has been provided with contact information for the Managed Medicaid care management team and has been advised to call  with any health related questions or concerns.    Gus Puma, BSW, [facility]   [employer] Risk Managed Medicaid Team  [phone]

## 2021-04-14 ENCOUNTER — Ambulatory Visit (INDEPENDENT_AMBULATORY_CARE_PROVIDER_SITE_OTHER)
Admission: RE | Admit: 2021-04-14 | Discharge: 2021-04-14 | Disposition: A | Discharge status: Home / Self Care | Payer: Medicaid Other | Source: Ambulatory Visit | Attending: Sports Medicine | Admitting: Sports Medicine

## 2021-04-14 ENCOUNTER — Other Ambulatory Visit: Payer: Self-pay

## 2021-04-14 ENCOUNTER — Ambulatory Visit (INDEPENDENT_AMBULATORY_CARE_PROVIDER_SITE_OTHER): Payer: Medicaid Other | Admitting: Sports Medicine

## 2021-04-14 ENCOUNTER — Ambulatory Visit: Payer: Medicaid Other | Admitting: Family

## 2021-04-14 VITALS — BP 134/80 | HR 90 | Ht 63.0 in | Wt 263.0 lb

## 2021-04-14 DIAGNOSIS — G8929 Other chronic pain: Secondary | ICD-10-CM | POA: Diagnosis not present

## 2021-04-14 DIAGNOSIS — M5441 Lumbago with sciatica, right side: Secondary | ICD-10-CM

## 2021-04-14 DIAGNOSIS — M5442 Lumbago with sciatica, left side: Secondary | ICD-10-CM | POA: Diagnosis not present

## 2021-04-14 DIAGNOSIS — M546 Pain in thoracic spine: Secondary | ICD-10-CM

## 2021-04-14 DIAGNOSIS — M542 Cervicalgia: Secondary | ICD-10-CM | POA: Diagnosis not present

## 2021-04-14 MED ORDER — MELOXICAM 15 MG PO TABS: 15.0000 mg | ORAL_TABLET | Freq: Every day | ORAL | 0 refills | Status: DC

## 2021-04-14 NOTE — Patient Instructions (Addendum)
Good to see you  Stop taking Naproxen  Start meloxicam 15 mg daily for 3 weeks use the remainder as needed Do not take any other NSAIDS Start tylenol 500 mg 1 tablet 3 times daily  Start HEP for back  Physical therapy referral  [facility] for cspine, lumbar , and thoracic xray 4 week follow up

## 2021-04-14 NOTE — Progress Notes (Signed)
Aleen Sells D.[facility] [address] [place] [address] Phone: [phone]   Assessment and Plan:    1. Chronic bilateral low back pain with bilateral sciatica 2. Chronic bilateral thoracic back pain 3. Cervical spine pain -Chronic with exacerbation, initial sports medicine visit - 2 years of neck, mid back, low back pain with significant worsening over the past 1 year, radicular symptoms - Patient complains of urinary urgency, however this does not seem to be an upper motor neuron symptom based on patient description, and otherwise unremarkable physical exam.  Because of this, we will continue with conservative therapy and work-up.  We will proceed with MRI if no improvement at 6 weeks of conservative therapy - Recommend patient getting C-spine, T-spine, L-spine x-rays.  Patient says she will go to [facility] after this visit to have those performed - Discontinue daily naproxen which patient has been on for 1 year - Start meloxicam 15 mg daily x3 weeks.  May use remainder as needed for pain control.  Advised patient that I would not plan on taking NSAIDs chronically for daily pain relief due to potential long-term side effects - Start Tylenol 500 mg 3 times daily - Start HEP for back and neck - Referral to physical therapy for back and neck - Ambulatory referral to Physical Therapy - DG Cervical Spine Complete; Future    Pertinent previous records reviewed include none   Follow Up: 4 weeks for reevaluation.  Could consider OMT.  If no improvement in 6 weeks, would order MRI to further evaluate   Subjective:   I, [name], am serving as a Neurosurgeon for Doctor Richardean Sale  Chief Complaint: low back pain   HPI:   04/14/21 Patient is a 41 year old female complaining of low back pain. Patient states that she has spinal stenosis for the past two years she has been able to deal with the pain. But last year the pain has gotten to bad and  constant the pain is radiating up and down. Cant walk for long periods of time has pain even when laying down. Tramadol isn't helping with the pain. Numbness tingling feet and hands, which patient says is intermittent and not constant.  Tried heat and ice but its only a quick fix.  She says that she feels that she has difficulty holding her bladder for long periods of time.  She feels like she urgently has to rush to the bathroom whenever she starts to feel an urgency.  Has not urinated on herself without noticing.  Has noticed some intermittent leaking.  She feels like bilateral legs have been generally swollen without shortness of breath, erythema, unilateral swelling.  Has been taking naproxen 500 mg twice daily daily for over 1 year prescribed by her podiatrist per patient.  She says that she feels the naproxen has helped with her foot pain, but has not noticed an improvement in her back pain.    Relevant Historical Information: COPD  Additional pertinent review of systems negative.   Current Outpatient Medications:    albuterol (VENTOLIN HFA) 108 (90 Base) MCG/ACT inhaler, Inhale 2 puffs into the lungs every 4 (four) hours as needed for wheezing or shortness of breath., Disp: 1 each, Rfl: 2   doxycycline (VIBRAMYCIN) 100 MG capsule, Take 1 capsule (100 mg total) by mouth 2 (two) times daily., Disp: 14 capsule, Rfl: 0   guaiFENesin (MUCINEX) 600 MG 12 hr tablet, Take 600 mg by mouth 2 (two) times  daily as needed for cough., Disp: , Rfl:    meloxicam (MOBIC) 15 MG tablet, Take 1 tablet (15 mg total) by mouth daily., Disp: 30 tablet, Rfl: 0   Menthol, Topical Analgesic, (BIOFREEZE EX), Apply 1 application topically as needed (pain)., Disp: , Rfl:    nicotine (NICODERM CQ - DOSED IN MG/24 HOURS) 21 mg/24hr patch, Place 1 patch (21 mg total) onto the skin daily., Disp: 28 patch, Rfl: 0   predniSONE (DELTASONE) 20 MG tablet, Take 2 tablets (40 mg total) by mouth daily with breakfast., Disp: 10 tablet,  Rfl: 0   Objective:     Vitals:   04/14/21 1032  BP: 134/80  Pulse: 90  SpO2: 97%  Weight: 263 lb (119.3 kg)  Height: 5\' 3"  (1.6 m)      Body mass index is 46.59 kg/m.    Physical Exam:    Gen: Appears well, nad, nontoxic and pleasant Psych: Alert and oriented, appropriate mood and affect Neuro: sensation intact, strength is 5/5 in upper and lower extremities, muscle tone wnl Skin: no susupicious lesions or rashes  Back - Normal skin, Spine with normal alignment and no deformity.   Mild tenderness to vertebral process palpation.   Paraspinous muscles are significantly tender and without spasm Straight leg raise negative   Cervical Spine: Posture normal Skin: normal, intact  Neurological:   Strength:  Right  Left   Deltoid 5/5 5/5  Bicep 5/5  5/5  Tricep 5/5 5/5  Wrist Flexion 5/5 5/5  Wrist Extension 5/5 5/5  Grip 5/5 5/5  Finger Abduction 5/5 5/5   Sensation: intact to light touch in upper extremities bilaterally  Spurling's:  negative bilaterally Neck ROM: Full active ROM TTP: cervical paraspinal, cervical spinous processes, thoracic paraspinal, trapezius   Electronically signed by:  D.[facility] 11:23 AM 04/14/21

## 2021-04-15 NOTE — Progress Notes (Signed)
I have reviewed the resulted tests. No significant abnormalities were found that would require further action at this time. Continue plan per my previous office note. Patient may contact our office if they have specific questions.

## 2021-04-15 NOTE — Progress Notes (Signed)
I have reviewed the resulted tests. No significant abnormalities were found that would require further action at this time. Continue plan per my previous office note. Patient may contact our office if they have specific questions.

## 2021-05-07 ENCOUNTER — Ambulatory Visit: Payer: Medicaid Other | Admitting: Family

## 2021-05-11 ENCOUNTER — Other Ambulatory Visit: Payer: Self-pay | Admitting: Sports Medicine

## 2021-05-12 NOTE — Progress Notes (Signed)
Christina Rosales. CAQSM [facility] [address] [place] [id number] Phone: [phone]   Assessment and Plan:     1. Chronic bilateral low back pain with bilateral sciatica 2. Chronic bilateral thoracic back pain 3. Cervical spine pain -Chronic with exacerbation, subsequent visit - Generalized improvement and exacerbation of chronic back pain has been waxing and waning for years - Overall improvement with course of meloxicam, restarting physical activity - Discontinue meloxicam - Start Tylenol 500 to 1000 mg 2-3 times a day as needed for pain relief - Congratulated patient on her desire to restart physical activity and weight loss journey.  I think both of these will be significantly helpful in decreasing her chronic back pain -Reviewed C-spine, T-spine, lumbar spine x-rays with patient in room.  My interpretation: No acute fracture or dislocation.  No significant bony abnormalities - Patient could not understand the voicemail left by physical therapy, so she has not establish care.  We will provide patient with telephone number to start physical therapy - Continue HEP  Pertinent previous records reviewed include none pertinent   Follow Up: 4 weeks for reevaluation.  If no improvement or worsening of symptoms, could consider Cymbalta versus advanced imaging   Subjective:   I, Christina Rosales, am serving as a Neurosurgeonscribe for Doctor Richardean SaleBenjamin Inda Mcglothen  Chief Complaint: low back pain   HPI:  04/14/21 Patient is a 41 year old female complaining of low back pain. Patient states that she has spinal stenosis for the past two years she has been able to deal with the pain. But last year the pain has gotten to bad and constant the pain is radiating up and down. Cant walk for long periods of time has pain even when laying down. Tramadol isn't helping with the pain. Numbness tingling feet and hands, which patient says is intermittent and not constant.  Tried heat and  ice but its only a quick fix.  She says that she feels that she has difficulty holding her bladder for long periods of time.  She feels like she urgently has to rush to the bathroom whenever she starts to feel an urgency.  Has not urinated on herself without noticing.  Has noticed some intermittent leaking.  She feels like bilateral legs have been generally swollen without shortness of breath, erythema, unilateral swelling.  Has been taking naproxen 500 mg twice daily daily for over 1 year prescribed by her podiatrist per patient.  She says that she feels the naproxen has helped with her foot pain, but has not noticed an improvement in her back pain.    05/19/2021 Patient states that her back still hurts but not as bad as when she came in the first time    Relevant Historical Information: COPD  Additional pertinent review of systems negative.   Current Outpatient Medications:    albuterol (VENTOLIN HFA) 108 (90 Base) MCG/ACT inhaler, Inhale 2 puffs into the lungs every 4 (four) hours as needed for wheezing or shortness of breath., Disp: 1 each, Rfl: 2   doxycycline (VIBRAMYCIN) 100 MG capsule, Take 1 capsule (100 mg total) by mouth 2 (two) times daily., Disp: 14 capsule, Rfl: 0   guaiFENesin (MUCINEX) 600 MG 12 hr tablet, Take 600 mg by mouth 2 (two) times daily as needed for cough., Disp: , Rfl:    meloxicam (MOBIC) 15 MG tablet, Take 1 tablet (15 mg total) by mouth daily., Disp: 30 tablet, Rfl: 0   Menthol, Topical Analgesic, (BIOFREEZE EX),  Apply 1 application topically as needed (pain)., Disp: , Rfl:    nicotine (NICODERM CQ - DOSED IN MG/24 HOURS) 21 mg/24hr patch, Place 1 patch (21 mg total) onto the skin daily., Disp: 28 patch, Rfl: 0   predniSONE (DELTASONE) 20 MG tablet, Take 2 tablets (40 mg total) by mouth daily with breakfast., Disp: 10 tablet, Rfl: 0   Objective:     Vitals:   05/19/21 1026  BP: 118/80  Pulse: 89  SpO2: 96%  Weight: 263 lb (119.3 kg)  Height: 5\' 3"  (1.6 m)       Body mass index is 46.59 kg/m.    Physical Exam:    Gen: Appears well, nad, nontoxic and pleasant Psych: Alert and oriented, appropriate mood and affect Neuro: sensation intact, strength is 5/5 in upper and lower extremities, muscle tone wnl Skin: no susupicious lesions or rashes  Back - Normal skin, Spine with normal alignment and no deformity.   No tenderness to vertebral process palpation.   Paraspinous muscles are generally tender and without spasm, though decreased compared to prior office visit Straight leg raise negative Trendelenberg negative    Electronically signed by:  Benito Mccreedy D.[facility] 10:48 AM 05/19/21

## 2021-05-19 ENCOUNTER — Other Ambulatory Visit: Payer: Self-pay

## 2021-05-19 ENCOUNTER — Ambulatory Visit (INDEPENDENT_AMBULATORY_CARE_PROVIDER_SITE_OTHER): Payer: Medicaid Other | Admitting: Sports Medicine

## 2021-05-19 VITALS — BP 118/80 | HR 89 | Ht 63.0 in | Wt 263.0 lb

## 2021-05-19 DIAGNOSIS — M542 Cervicalgia: Secondary | ICD-10-CM | POA: Diagnosis not present

## 2021-05-19 DIAGNOSIS — G8929 Other chronic pain: Secondary | ICD-10-CM

## 2021-05-19 DIAGNOSIS — M5441 Lumbago with sciatica, right side: Secondary | ICD-10-CM | POA: Diagnosis not present

## 2021-05-19 DIAGNOSIS — M546 Pain in thoracic spine: Secondary | ICD-10-CM

## 2021-05-19 DIAGNOSIS — M5442 Lumbago with sciatica, left side: Secondary | ICD-10-CM

## 2021-05-19 NOTE — Patient Instructions (Addendum)
Good to see you  Discontinue meloxicam  Tylenol 500 mg 1-2 tablets  2-3 times a day for pain relief  [facility]  [phone] 4 week  follow up

## 2021-06-11 ENCOUNTER — Ambulatory Visit: Payer: Medicaid Other | Admitting: Family

## 2021-06-15 NOTE — Progress Notes (Deleted)
? ?   Aleen Sells D.Judd Gaudier ?[facility] ?[address] [place] [address] ?Phone: [phone] ?  ?Assessment and Plan:   ?  ?There are no diagnoses linked to this encounter.  ?*** ?  ?Pertinent previous records reviewed include *** ?  ?Follow Up: ***  ? ?  ?Subjective:   ?I, Jerene Canny, am serving as a Neurosurgeon for Doctor [employer] ? ?Chief Complaint: back pain  ? ?HPI:  ?04/14/21 ?Patient is a 41 year old female complaining of low back pain. Patient states that she has spinal stenosis for the past two years she has been able to deal with the pain. But last year the pain has gotten to bad and constant the pain is radiating up and down. Cant walk for long periods of time has pain even when laying down. Tramadol isn't helping with the pain. Numbness tingling feet and hands, which patient says is intermittent and not constant.  Tried heat and ice but its only a quick fix.  She says that she feels that she has difficulty holding her bladder for long periods of time.  She feels like she urgently has to rush to the bathroom whenever she starts to feel an urgency.  Has not urinated on herself without noticing.  Has noticed some intermittent leaking.  She feels like bilateral legs have been generally swollen without shortness of breath, erythema, unilateral swelling.  Has been taking naproxen 500 mg twice daily daily for over 1 year prescribed by her podiatrist per patient.  She says that she feels the naproxen has helped with her foot pain, but has not noticed an improvement in her back pain. ?  ? 05/19/2021 ?Patient states that her back still hurts but not as bad as when she came in the first time ? ?3/14/023 ?Patient states ? ?  ?Relevant Historical Information: COPD ? ?Additional pertinent review of systems negative. ? ? ?Current Outpatient Medications:  ?  albuterol (VENTOLIN HFA) 108 (90 Base) MCG/ACT inhaler, Inhale 2 puffs into the lungs every 4 (four) hours as needed for  wheezing or shortness of breath., Disp: 1 each, Rfl: 2 ?  doxycycline (VIBRAMYCIN) 100 MG capsule, Take 1 capsule (100 mg total) by mouth 2 (two) times daily., Disp: 14 capsule, Rfl: 0 ?  guaiFENesin (MUCINEX) 600 MG 12 hr tablet, Take 600 mg by mouth 2 (two) times daily as needed for cough., Disp: , Rfl:  ?  meloxicam (MOBIC) 15 MG tablet, Take 1 tablet (15 mg total) by mouth daily., Disp: 30 tablet, Rfl: 0 ?  Menthol, Topical Analgesic, (BIOFREEZE EX), Apply 1 application topically as needed (pain)., Disp: , Rfl:  ?  nicotine (NICODERM CQ - DOSED IN MG/24 HOURS) 21 mg/24hr patch, Place 1 patch (21 mg total) onto the skin daily., Disp: 28 patch, Rfl: 0 ?  predniSONE (DELTASONE) 20 MG tablet, Take 2 tablets (40 mg total) by mouth daily with breakfast., Disp: 10 tablet, Rfl: 0  ? ?Objective:   ?  ?There were no vitals filed for this visit.  ?  ?There is no height or weight on file to calculate BMI.  ?  ?Physical Exam:   ? ?*** ? ? ?Electronically signed by:  ?Aleen Sells D.Judd Gaudier ?[facility] ?1:33 PM 06/15/21 ?

## 2021-06-16 ENCOUNTER — Ambulatory Visit: Payer: Medicaid Other | Admitting: Sports Medicine

## 2021-09-01 DIAGNOSIS — Z23 Encounter for immunization: Secondary | ICD-10-CM | POA: Diagnosis not present

## 2021-09-29 DIAGNOSIS — M722 Plantar fascial fibromatosis: Secondary | ICD-10-CM | POA: Diagnosis not present

## 2021-09-29 DIAGNOSIS — M7989 Other specified soft tissue disorders: Secondary | ICD-10-CM | POA: Diagnosis not present

## 2021-10-20 DIAGNOSIS — M722 Plantar fascial fibromatosis: Secondary | ICD-10-CM | POA: Diagnosis not present

## 2021-10-28 DIAGNOSIS — N760 Acute vaginitis: Secondary | ICD-10-CM | POA: Diagnosis not present

## 2021-10-28 DIAGNOSIS — Z114 Encounter for screening for human immunodeficiency virus [HIV]: Secondary | ICD-10-CM | POA: Diagnosis not present

## 2021-10-28 DIAGNOSIS — Z113 Encounter for screening for infections with a predominantly sexual mode of transmission: Secondary | ICD-10-CM | POA: Diagnosis not present

## 2022-02-27 IMAGING — DX DG CHEST 1V PORT
1 series · 1 of 1 positions shown · non-contrast
Comparison: 03/11/2020

CLINICAL DATA: Shortness of breath

EXAM:
PORTABLE CHEST 1 VIEW

[chest ap]
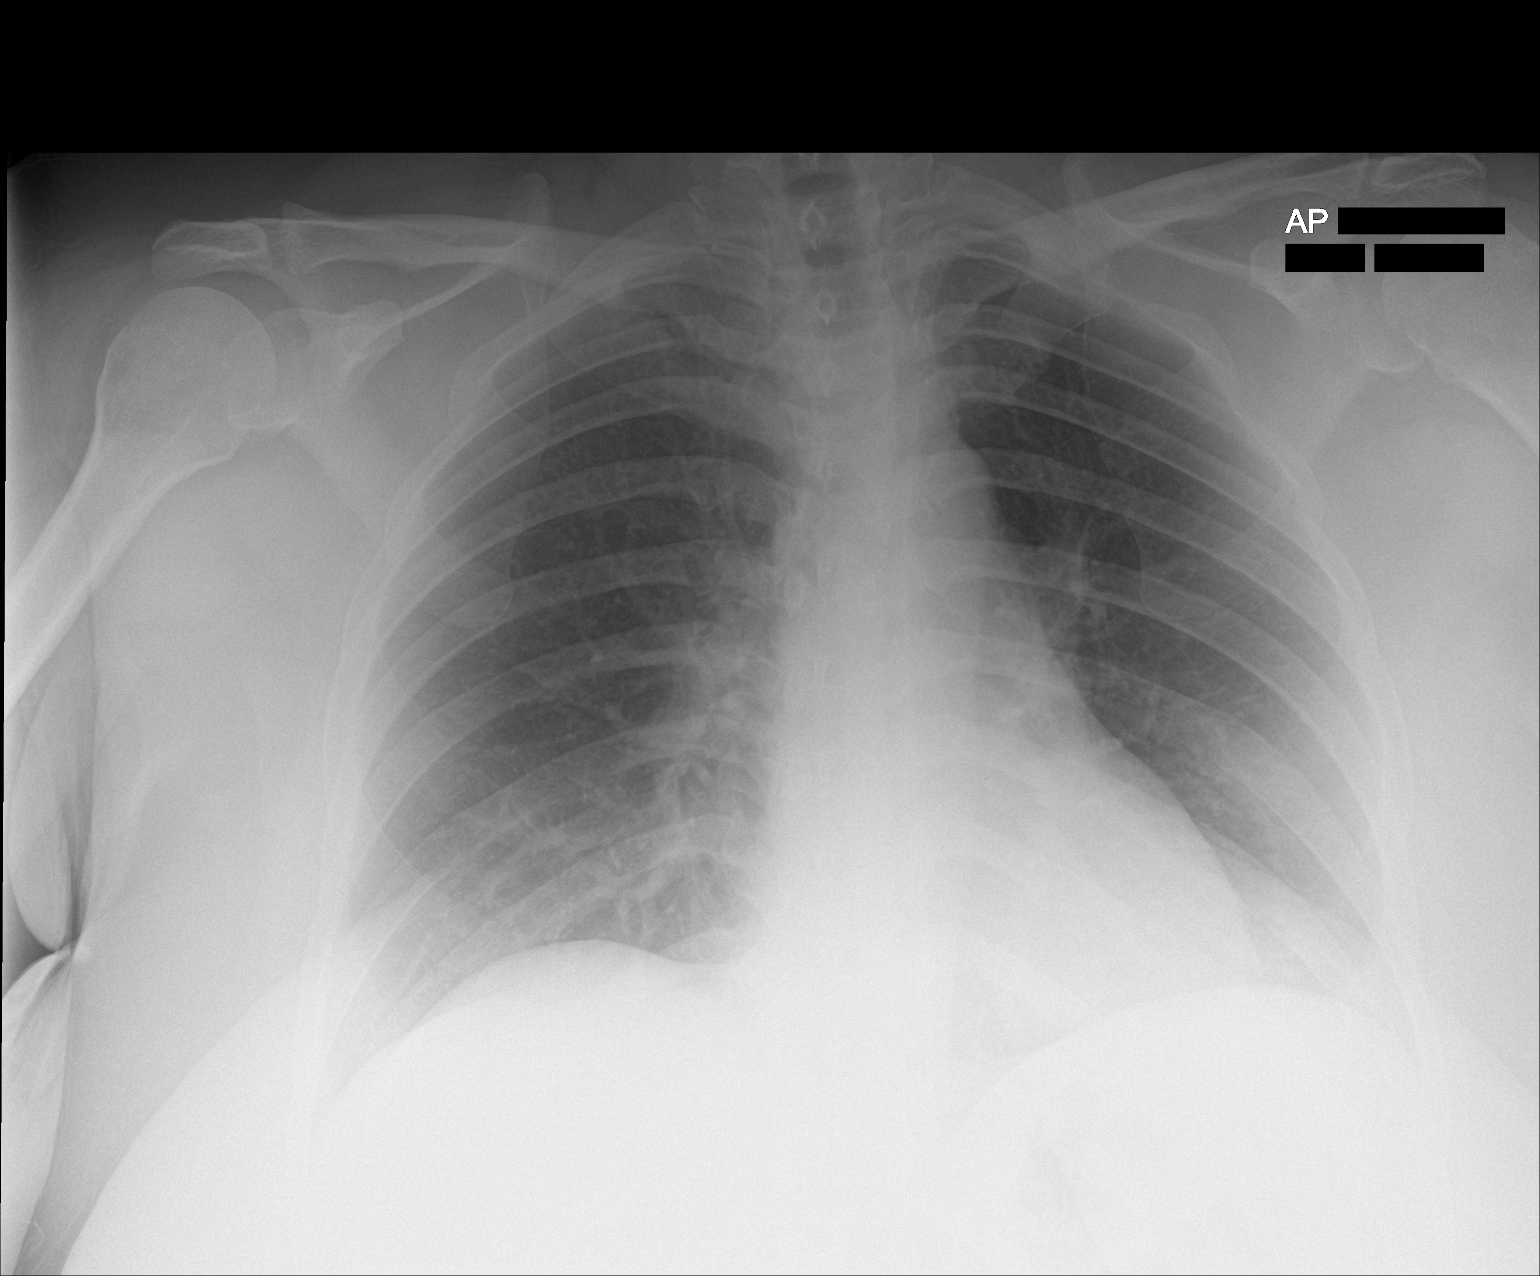

[1 of 1 positions shown; findings below may reference images not displayed]

FINDINGS: The heart size and mediastinal contours are within normal limits.
Both lungs are clear. The visualized skeletal structures are
unremarkable.
IMPRESSION: No active disease.

## 2022-03-30 ENCOUNTER — Other Ambulatory Visit: Payer: Self-pay

## 2022-03-30 ENCOUNTER — Emergency Department (HOSPITAL_BASED_OUTPATIENT_CLINIC_OR_DEPARTMENT_OTHER): Payer: Medicaid Other | Admitting: Radiology

## 2022-03-30 ENCOUNTER — Emergency Department (HOSPITAL_COMMUNITY)
Admission: EM | Admit: 2022-03-30 | Discharge: 2022-03-30 | Discharge status: Against Medical Advice | Payer: Medicaid Other

## 2022-03-30 ENCOUNTER — Ambulatory Visit: Payer: Self-pay | Admitting: *Deleted

## 2022-03-30 ENCOUNTER — Emergency Department (HOSPITAL_BASED_OUTPATIENT_CLINIC_OR_DEPARTMENT_OTHER)
Admission: EM | Admit: 2022-03-30 | Discharge: 2022-03-30 | Disposition: A | Discharge status: Home / Self Care | Payer: Medicaid Other | Attending: Emergency Medicine | Admitting: Emergency Medicine

## 2022-03-30 ENCOUNTER — Encounter (HOSPITAL_BASED_OUTPATIENT_CLINIC_OR_DEPARTMENT_OTHER): Payer: Self-pay

## 2022-03-30 DIAGNOSIS — R059 Cough, unspecified: Secondary | ICD-10-CM | POA: Diagnosis present

## 2022-03-30 DIAGNOSIS — Z1152 Encounter for screening for COVID-19: Secondary | ICD-10-CM | POA: Insufficient documentation

## 2022-03-30 DIAGNOSIS — J101 Influenza due to other identified influenza virus with other respiratory manifestations: Secondary | ICD-10-CM | POA: Insufficient documentation

## 2022-03-30 DIAGNOSIS — J111 Influenza due to unidentified influenza virus with other respiratory manifestations: Secondary | ICD-10-CM

## 2022-03-30 LAB — RESP PANEL BY RT-PCR (RSV, FLU A&B, COVID)  RVPGX2
Influenza A by PCR: POSITIVE — AB
Influenza B by PCR: NEGATIVE
Resp Syncytial Virus by PCR: NEGATIVE
SARS Coronavirus 2 by RT PCR: NEGATIVE

## 2022-03-30 MED ORDER — ALBUTEROL SULFATE (2.5 MG/3ML) 0.083% IN NEBU: 2.5000 mg | INHALATION_SOLUTION | Freq: Once | RESPIRATORY_TRACT | Status: AC

## 2022-03-30 MED ORDER — DEXAMETHASONE 4 MG PO TABS
10.0000 mg | ORAL_TABLET | Freq: Once | ORAL | Status: AC
  Administered 2022-03-30: 10 mg via ORAL
  Filled 2022-03-30: qty 3

## 2022-03-30 MED ORDER — IPRATROPIUM-ALBUTEROL 0.5-2.5 (3) MG/3ML IN SOLN: 3.0000 mL | Freq: Once | RESPIRATORY_TRACT | Status: AC

## 2022-03-30 MED ORDER — ALBUTEROL SULFATE (2.5 MG/3ML) 0.083% IN NEBU
INHALATION_SOLUTION | RESPIRATORY_TRACT | Status: AC
  Administered 2022-03-30: 2.5 mg
  Filled 2022-03-30: qty 3

## 2022-03-30 MED ORDER — IPRATROPIUM-ALBUTEROL 0.5-2.5 (3) MG/3ML IN SOLN
RESPIRATORY_TRACT | Status: AC
  Administered 2022-03-30: 3 mL
  Filled 2022-03-30: qty 3

## 2022-03-30 NOTE — Telephone Encounter (Signed)
Reason for Disposition  Patient sounds very sick or weak to the triager    Coughing non stop can't hardly talk and short of breath as a result.  Answer Assessment - Initial Assessment Questions 1. ONSET: "When did the cough begin?"      Pt coughing so much can't hardly talk.    I'm burning in my chest and very short of breath with exertion.    I've been to 2 EDs and 1 urgent care and all of them have a very long wait.    That's why I'm calling you.   I asked her who she was calling because her call came in on the community line.   She's trying to go to the [facility] ED and wanted to know if she could make an appt or see how long the wait was.    I let her know they don't take appts that it's done on the triage system and I didn't know how long the wait was.    She is going to go on to the [facility] location.     I let her know she did need to be seen today as she is coughing so much she can hardly talk and is short of breath. 2. SEVERITY: "How bad is the cough today?"      I'm coughing non stop 3. SPUTUM: "Describe the color of your sputum" (none, dry cough; clear, white, yellow, green)     Triage stopped at this point due to coughing so much and she has decided to go on to the [facility] ED in [place]. 4. HEMOPTYSIS: "Are you coughing up any blood?" If so ask: "How much?" (flecks, streaks, tablespoons, etc.)     Not asked 5. DIFFICULTY BREATHING: "Are you having difficulty breathing?" If Yes, ask: "How bad is it?" (e.g., mild, moderate, severe)    - MILD: No SOB at rest, mild SOB with walking, speaks normally in sentences, can lie down, no retractions, pulse < 100.    - MODERATE: SOB at rest, SOB with minimal exertion and prefers to sit, cannot lie down flat, speaks in phrases, mild retractions, audible wheezing, pulse 100-120.    - SEVERE: Very SOB at rest, speaks in single words, struggling to breathe, sitting hunched forward, retractions, pulse > 120      Moderate shortness of breath due  to coughing so much. 6. FEVER: "Do you have a fever?" If Yes, ask: "What is your temperature, how was it measured, and when did it start?"      7. CARDIAC HISTORY: "Do you have any history of heart disease?" (e.g., heart attack, congestive heart failure)       8. LUNG HISTORY: "Do you have any history of lung disease?"  (e.g., pulmonary embolus, asthma, emphysema)      9. PE RISK FACTORS: "Do you have a history of blood clots?" (or: recent major surgery, recent prolonged travel, bedridden)      10. OTHER SYMPTOMS: "Do you have any other symptoms?" (e.g., runny nose, wheezing, chest pain)        11. PREGNANCY: "Is there any chance you are pregnant?" "When was your last menstrual period?"        12. TRAVEL: "Have you traveled out of the country in the last month?" (e.g., travel history, exposures)  Protocols used: Cough - Acute Productive-A-AH

## 2022-03-30 NOTE — ED Provider Notes (Signed)
[facility] EMERGENCY DEPT Provider Note   CSN: [id number] Arrival date & time: 03/30/22  1410     History  Chief Complaint  Patient presents with   Cough    Christina Rosales is a 41 y.o. female.  Patient here with flulike symptoms for the last 3 days.  Nothing makes it worse or better.  Denies any history of asthma or allergies.  Cough but no sputum production.  Denies any chest pain.  Sometimes short of breath when she is coughing.  Denies any current nausea vomiting diarrhea but had some earlier when this started.  The history is provided by the patient.       Home Medications Prior to Admission medications   Medication Sig Start Date End Date Taking? Authorizing Provider  albuterol (VENTOLIN HFA) 108 (90 Base) MCG/ACT inhaler Inhale 2 puffs into the lungs every 4 (four) hours as needed for wheezing or shortness of breath. 03/10/21   Gilda Crease, MD  doxycycline (VIBRAMYCIN) 100 MG capsule Take 1 capsule (100 mg total) by mouth 2 (two) times daily. 03/10/21   Gilda Crease, MD  guaiFENesin (MUCINEX) 600 MG 12 hr tablet Take 600 mg by mouth 2 (two) times daily as needed for cough.    [provider]  meloxicam (MOBIC) 15 MG tablet Take 1 tablet (15 mg total) by mouth daily. 04/14/21   Richardean Sale, DO  Menthol, Topical Analgesic, (BIOFREEZE EX) Apply 1 application topically as needed (pain).    [provider]  nicotine (NICODERM CQ - DOSED IN MG/24 HOURS) 21 mg/24hr patch Place 1 patch (21 mg total) onto the skin daily. 03/15/21   Standley Brooking, MD  predniSONE (DELTASONE) 20 MG tablet Take 2 tablets (40 mg total) by mouth daily with breakfast. 03/10/21   Pollina, Canary Brim, MD      Allergies    Kiwi extract and Percocet [oxycodone-acetaminophen]    Review of Systems   Review of Systems  Physical Exam Updated Vital Signs BP (!) 124/54 (BP Location: Right Arm)   Pulse 90   Temp 99.5 F (37.5 C) (Oral)    Resp 18   Ht 5\' 3"  (1.6 m)   Wt 119.3 kg   SpO2 98%   BMI 46.59 kg/m  Physical Exam Vitals and nursing note reviewed.  Constitutional:      General: She is not in acute distress.    Appearance: She is well-developed.  HENT:     Head: Normocephalic and atraumatic.     Mouth/Throat:     Mouth: Mucous membranes are moist.  Eyes:     Extraocular Movements: Extraocular movements intact.     Conjunctiva/sclera: Conjunctivae normal.     Pupils: Pupils are equal, round, and reactive to light.  Cardiovascular:     Rate and Rhythm: Normal rate and regular rhythm.     Pulses: Normal pulses.     Heart sounds: Normal heart sounds. No murmur heard. Pulmonary:     Effort: Pulmonary effort is normal. No respiratory distress.     Breath sounds: Normal breath sounds.  Abdominal:     Palpations: Abdomen is soft.     Tenderness: There is no abdominal tenderness.  Musculoskeletal:        General: No swelling.     Cervical back: Neck supple.  Skin:    General: Skin is warm and dry.     Capillary Refill: Capillary refill takes less than 2 seconds.  Neurological:     Mental Status: She  is alert.  Psychiatric:        Mood and Affect: Mood normal.     ED Results / Procedures / Treatments   Labs (all labs ordered are listed, but only abnormal results are displayed) Labs Reviewed  RESP PANEL BY RT-PCR (RSV, FLU A&B, COVID)  RVPGX2 - Abnormal; Notable for the following components:      Result Value   Influenza A by PCR POSITIVE (*)    All other components within normal limits    EKG None  Radiology No results found.  Procedures Procedures    Medications Ordered in ED Medications  ipratropium-albuterol (DUONEB) 0.5-2.5 (3) MG/3ML nebulizer solution 3 mL (has no administration in time range)  albuterol (PROVENTIL) (2.5 MG/3ML) 0.083% nebulizer solution 2.5 mg (has no administration in time range)  dexamethasone (DECADRON) tablet 10 mg (has no administration in time range)   ipratropium-albuterol (DUONEB) 0.5-2.5 (3) MG/3ML nebulizer solution (3 mLs  Given 03/30/22 1526)  albuterol (PROVENTIL) (2.5 MG/3ML) 0.083% nebulizer solution (2.5 mg  Given 03/30/22 1527)    ED Course/ Medical Decision Making/ A&P                           Medical Decision Making Amount and/or Complexity of Data Reviewed Radiology: ordered.  Risk Prescription drug management.   Christina Rosales is here with flulike symptoms.  No significant medical history.  Positive for influenza A.  Well-appearing.  Clear breath sounds.  Was given a breathing treatment while in triage.  Suspect that she is having some cough from the flu.  Will give her dose of Decadron.  Recommend continued use of Tylenol and ibuprofen at home.  Symptoms have only been for 3 days and have very low suspicion for secondary pneumonia at this time.  Recommend return if symptoms do not improve over the course of the next few days to further evaluate for pneumonia.  Discharged in good condition.  This chart was dictated using voice recognition software.  Despite best efforts to proofread,  errors can occur which can change the documentation meaning.         Final Clinical Impression(s) / ED Diagnoses Final diagnoses:  Influenza    Rx / DC Orders ED Discharge Orders     None         Virgina Norfolk, DO 03/30/22 1556

## 2022-03-30 NOTE — ED Triage Notes (Signed)
Patient here POV from Home.  Endorses Cough, Fever, Aches, Chills that began Friday. OTC Therapy has been mostly ineffective but today her symptoms worsened and she has been having some SOB.  NAD Noted during Triage. A&Ox4. GCS 15. Ambulatory.

## 2022-05-14 ENCOUNTER — Other Ambulatory Visit: Payer: Self-pay

## 2022-05-14 ENCOUNTER — Emergency Department (HOSPITAL_COMMUNITY): Payer: Medicaid Other

## 2022-05-14 ENCOUNTER — Emergency Department (HOSPITAL_COMMUNITY)
Admission: EM | Admit: 2022-05-14 | Discharge: 2022-05-14 | Disposition: A | Discharge status: Home / Self Care | Payer: Medicaid Other | Attending: Emergency Medicine | Admitting: Emergency Medicine

## 2022-05-14 ENCOUNTER — Encounter (HOSPITAL_COMMUNITY): Payer: Self-pay

## 2022-05-14 DIAGNOSIS — B338 Other specified viral diseases: Secondary | ICD-10-CM

## 2022-05-14 DIAGNOSIS — J449 Chronic obstructive pulmonary disease, unspecified: Secondary | ICD-10-CM | POA: Insufficient documentation

## 2022-05-14 DIAGNOSIS — Z8616 Personal history of COVID-19: Secondary | ICD-10-CM | POA: Diagnosis not present

## 2022-05-14 DIAGNOSIS — R059 Cough, unspecified: Secondary | ICD-10-CM | POA: Insufficient documentation

## 2022-05-14 DIAGNOSIS — B974 Respiratory syncytial virus as the cause of diseases classified elsewhere: Secondary | ICD-10-CM | POA: Insufficient documentation

## 2022-05-14 DIAGNOSIS — R509 Fever, unspecified: Secondary | ICD-10-CM | POA: Insufficient documentation

## 2022-05-14 DIAGNOSIS — R5383 Other fatigue: Secondary | ICD-10-CM | POA: Diagnosis not present

## 2022-05-14 DIAGNOSIS — Z1152 Encounter for screening for COVID-19: Secondary | ICD-10-CM | POA: Diagnosis not present

## 2022-05-14 DIAGNOSIS — R197 Diarrhea, unspecified: Secondary | ICD-10-CM | POA: Insufficient documentation

## 2022-05-14 DIAGNOSIS — R0602 Shortness of breath: Secondary | ICD-10-CM | POA: Diagnosis not present

## 2022-05-14 LAB — CBC WITH DIFFERENTIAL/PLATELET
Abs Immature Granulocytes: 0.01 10*3/uL (ref 0.00–0.07)
Basophils Absolute: 0.1 10*3/uL (ref 0.0–0.1)
Basophils Relative: 1 %
Eosinophils Absolute: 0.2 10*3/uL (ref 0.0–0.5)
Eosinophils Relative: 4 %
HCT: 37.7 % (ref 36.0–46.0)
Hemoglobin: 11.9 g/dL — ABNORMAL LOW (ref 12.0–15.0)
Immature Granulocytes: 0 %
Lymphocytes Relative: 27 %
Lymphs Abs: 1.4 10*3/uL (ref 0.7–4.0)
MCH: 29.4 pg (ref 26.0–34.0)
MCHC: 31.6 g/dL (ref 30.0–36.0)
MCV: 93.1 fL (ref 80.0–100.0)
Monocytes Absolute: 0.5 10*3/uL (ref 0.1–1.0)
Monocytes Relative: 10 %
Neutro Abs: 3.1 10*3/uL (ref 1.7–7.7)
Neutrophils Relative %: 58 %
Platelets: 329 10*3/uL (ref 150–400)
RBC: 4.05 MIL/uL (ref 3.87–5.11)
RDW: 13.5 % (ref 11.5–15.5)
WBC: 5.3 10*3/uL (ref 4.0–10.5)
nRBC: 0 % (ref 0.0–0.2)

## 2022-05-14 LAB — BASIC METABOLIC PANEL
Anion gap: 7 (ref 5–15)
BUN: 9 mg/dL (ref 6–20)
CO2: 24 mmol/L (ref 22–32)
Calcium: 8.6 mg/dL — ABNORMAL LOW (ref 8.9–10.3)
Chloride: 105 mmol/L (ref 98–111)
Creatinine, Ser: 0.75 mg/dL (ref 0.44–1.00)
GFR, Estimated: >60 mL/min (ref >60)
Glucose, Bld: 128 mg/dL — ABNORMAL HIGH (ref 70–99)
Potassium: 3.6 mmol/L (ref 3.5–5.1)
Sodium: 136 mmol/L (ref 135–145)

## 2022-05-14 LAB — RESP PANEL BY RT-PCR (RSV, FLU A&B, COVID)  RVPGX2
Influenza A by PCR: NEGATIVE
Influenza B by PCR: NEGATIVE
Resp Syncytial Virus by PCR: POSITIVE — AB
SARS Coronavirus 2 by RT PCR: NEGATIVE

## 2022-05-14 MED ORDER — IPRATROPIUM-ALBUTEROL 0.5-2.5 (3) MG/3ML IN SOLN
3.0000 mL | Freq: Once | RESPIRATORY_TRACT | Status: AC
  Administered 2022-05-14: 3 mL via RESPIRATORY_TRACT
  Filled 2022-05-14: qty 3

## 2022-05-14 MED ORDER — KETOROLAC TROMETHAMINE 30 MG/ML IJ SOLN
30.0000 mg | Freq: Once | INTRAMUSCULAR | Status: AC
Start: 2022-05-14 — End: 2022-05-14
  Administered 2022-05-14: 30 mg via INTRAVENOUS
  Filled 2022-05-14: qty 1

## 2022-05-14 MED ORDER — ALBUTEROL SULFATE HFA 108 (90 BASE) MCG/ACT IN AERS: 2.0000 | INHALATION_SPRAY | RESPIRATORY_TRACT | 2 refills | Status: AC | PRN

## 2022-05-14 MED ORDER — BENZONATATE 100 MG PO CAPS: 100.0000 mg | ORAL_CAPSULE | Freq: Three times a day (TID) | ORAL | 0 refills | Status: DC

## 2022-05-14 MED ORDER — METHYLPREDNISOLONE SODIUM SUCC 125 MG IJ SOLR
125.0000 mg | Freq: Once | INTRAMUSCULAR | Status: AC
  Administered 2022-05-14: 125 mg via INTRAVENOUS
  Filled 2022-05-14: qty 2

## 2022-05-14 MED ORDER — GUAIFENESIN ER 600 MG PO TB12: 600.0000 mg | ORAL_TABLET | Freq: Two times a day (BID) | ORAL | 0 refills | Status: DC

## 2022-05-14 NOTE — Discharge Instructions (Addendum)
You have been seen and discharged from the emergency department.  Follow-up with your primary provider for further evaluation and further care. Take home medications as prescribed. If you have any worsening symptoms or further concerns for your health please return to an emergency department for further evaluation.

## 2022-05-14 NOTE — ED Triage Notes (Signed)
Pt states she has been running a low grade fever, sob, body aches, diarrhea that started on Wednesday. Recently got over the flu.

## 2022-05-14 NOTE — ED Provider Notes (Signed)
[facility] Provider Note   CSN: [id number] Arrival date & time: 05/14/22  1554     History  Chief Complaint  Patient presents with   Fever    Christina Rosales is a 42 y.o. female.  HPI   42 year old female past medical history of COPD presents to the emergency department with chills, shortness of breath, underlying productive cough and diarrhea.  Patient states her symptoms been going on the past 2 days.  No documented fever.  She denies any chest/back pain or pleuritic pain.  No swelling of her lower extremities.  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  Home Medications Prior to Admission medications   Medication Sig Start Date End Date Taking? Authorizing Provider  albuterol (VENTOLIN HFA) 108 (90 Base) MCG/ACT inhaler Inhale 2 puffs into the lungs every 4 (four) hours as needed for wheezing or shortness of breath. 03/10/21   Orpah Greek, MD  doxycycline (VIBRAMYCIN) 100 MG capsule Take 1 capsule (100 mg total) by mouth 2 (two) times daily. 03/10/21   Orpah Greek, MD  guaiFENesin (MUCINEX) 600 MG 12 hr tablet Take 600 mg by mouth 2 (two) times daily as needed for cough.    [provider]  meloxicam (MOBIC) 15 MG tablet Take 1 tablet (15 mg total) by mouth daily. 04/14/21   Glennon Mac, DO  Menthol, Topical Analgesic, (BIOFREEZE EX) Apply 1 application topically as needed (pain).    [provider]  nicotine (NICODERM CQ - DOSED IN MG/24 HOURS) 21 mg/24hr patch Place 1 patch (21 mg total) onto the skin daily. 03/15/21   Samuella Cota, MD  predniSONE (DELTASONE) 20 MG tablet Take 2 tablets (40 mg total) by mouth daily with breakfast. 03/10/21   Pollina, Gwenyth Allegra, MD      Allergies    Kiwi extract and Percocet [oxycodone-acetaminophen]    Review of Systems   Review of Systems  Constitutional:  Positive for chills and fatigue. Negative for fever.   Respiratory:  Positive for cough. Negative for chest tightness and shortness of breath.   Cardiovascular:  Negative for chest pain, palpitations and leg swelling.  Gastrointestinal:  Positive for diarrhea. Negative for abdominal pain and vomiting.  Genitourinary:  Negative for flank pain.  Musculoskeletal:  Negative for back pain.  Skin:  Negative for rash.  Neurological:  Negative for headaches.    Physical Exam Updated Vital Signs BP (!) 172/90   Pulse 84   Temp 98 F (36.7 C) (Oral)   Resp (!) 25   SpO2 100%  Physical Exam Vitals and nursing note reviewed.  Constitutional:      General: She is not in acute distress.    Appearance: Normal appearance.  HENT:     Head: Normocephalic.     Mouth/Throat:     Mouth: Mucous membranes are moist.  Cardiovascular:     Rate and Rhythm: Normal rate.  Pulmonary:     Effort: Pulmonary effort is normal. No respiratory distress.     Breath sounds: Wheezing present. No rales.  Abdominal:     Palpations: Abdomen is soft.     Tenderness: There is no abdominal tenderness.  Musculoskeletal:        General: No swelling.  Skin:    General: Skin is warm.  Neurological:     Mental Status: She is alert and oriented to person, place, and time. Mental status is at baseline.  Psychiatric:  Mood and Affect: Mood normal.     ED Results / Procedures / Treatments   Labs (all labs ordered are listed, but only abnormal results are displayed) Labs Reviewed  CBC WITH DIFFERENTIAL/PLATELET - Abnormal; Notable for the following components:      Result Value   Hemoglobin 11.9 (*)    All other components within normal limits  BASIC METABOLIC PANEL - Abnormal; Notable for the following components:   Glucose, Bld 128 (*)    Calcium 8.6 (*)    All other components within normal limits  RESP PANEL BY RT-PCR (RSV, FLU A&B, COVID)  RVPGX2    EKG EKG Interpretation  Date/Time:  Friday May 14 2022 16:03:11 EST Ventricular Rate:  90 PR  Interval:  167 QRS Duration: 89 QT Interval:  338 QTC Calculation: 414 R Axis:   60 Text Interpretation: Sinus rhythm Low voltage, precordial leads Confirmed by Lavenia Atlas [phone]) on 05/14/2022 5:23:00 PM  Radiology DG Chest Port 1 View  Result Date: 05/14/2022 CLINICAL DATA:  Cough. EXAM: PORTABLE CHEST 1 VIEW COMPARISON:  03/11/2021 and prior studies FINDINGS: Telemetry leads overlie the chest. The cardiomediastinal silhouette is unremarkable. There is no evidence of focal airspace disease, pulmonary edema, suspicious pulmonary nodule/mass, pleural effusion, or pneumothorax. No acute bony abnormalities are identified. IMPRESSION: No active disease. Electronically Signed   By: Margarette Canada M.D.   On: 05/14/2022 18:11    Procedures Procedures    Medications Ordered in ED Medications  ipratropium-albuterol (DUONEB) 0.5-2.5 (3) MG/3ML nebulizer solution 3 mL (3 mLs Nebulization Given 05/14/22 1804)  methylPREDNISolone sodium succinate (SOLU-MEDROL) 125 mg/2 mL injection 125 mg (125 mg Intravenous Given 05/14/22 1752)  ketorolac (TORADOL) 30 MG/ML injection 30 mg (30 mg Intravenous Given 05/14/22 1804)    ED Course/ Medical Decision Making/ A&P                             Medical Decision Making Amount and/or Complexity of Data Reviewed Labs: ordered. Radiology: ordered.  Risk Prescription drug management.   42 year old female presents emergency department with 2 days of illness.  Vitals are normal on arrival.  Blood work is reassuring without acute abnormality.  Chest x-ray shows no focal finding.  Respiratory swab is positive for RSV.  After breathing treatment, steroids and medicine here she feels improved.  Plan for outpatient symptomatic treatment.  No indication for antibiotics.  Patient at this time appears safe and stable for discharge and close outpatient follow up. Discharge plan and strict return to ED precautions discussed, patient verbalizes understanding and  agreement.        Final Clinical Impression(s) / ED Diagnoses Final diagnoses:  None    Rx / DC Orders ED Discharge Orders     None         Lorelle Gibbs, DO 05/14/22 2024

## 2022-06-11 DIAGNOSIS — M722 Plantar fascial fibromatosis: Secondary | ICD-10-CM | POA: Diagnosis not present

## 2022-07-09 DIAGNOSIS — M722 Plantar fascial fibromatosis: Secondary | ICD-10-CM | POA: Diagnosis not present

## 2022-07-16 DIAGNOSIS — H9203 Otalgia, bilateral: Secondary | ICD-10-CM | POA: Diagnosis not present

## 2022-07-16 DIAGNOSIS — H93293 Other abnormal auditory perceptions, bilateral: Secondary | ICD-10-CM | POA: Diagnosis not present

## 2022-07-16 DIAGNOSIS — H61893 Other specified disorders of external ear, bilateral: Secondary | ICD-10-CM | POA: Diagnosis not present

## 2022-07-22 IMAGING — DX DG LUMBAR SPINE COMPLETE 4+V
4 series · 5 of 5 positions shown · non-contrast
Comparison: None.

CLINICAL DATA: Low back pain.

EXAM:
LUMBAR SPINE - COMPLETE 4+ VIEW

[l-spine ap]
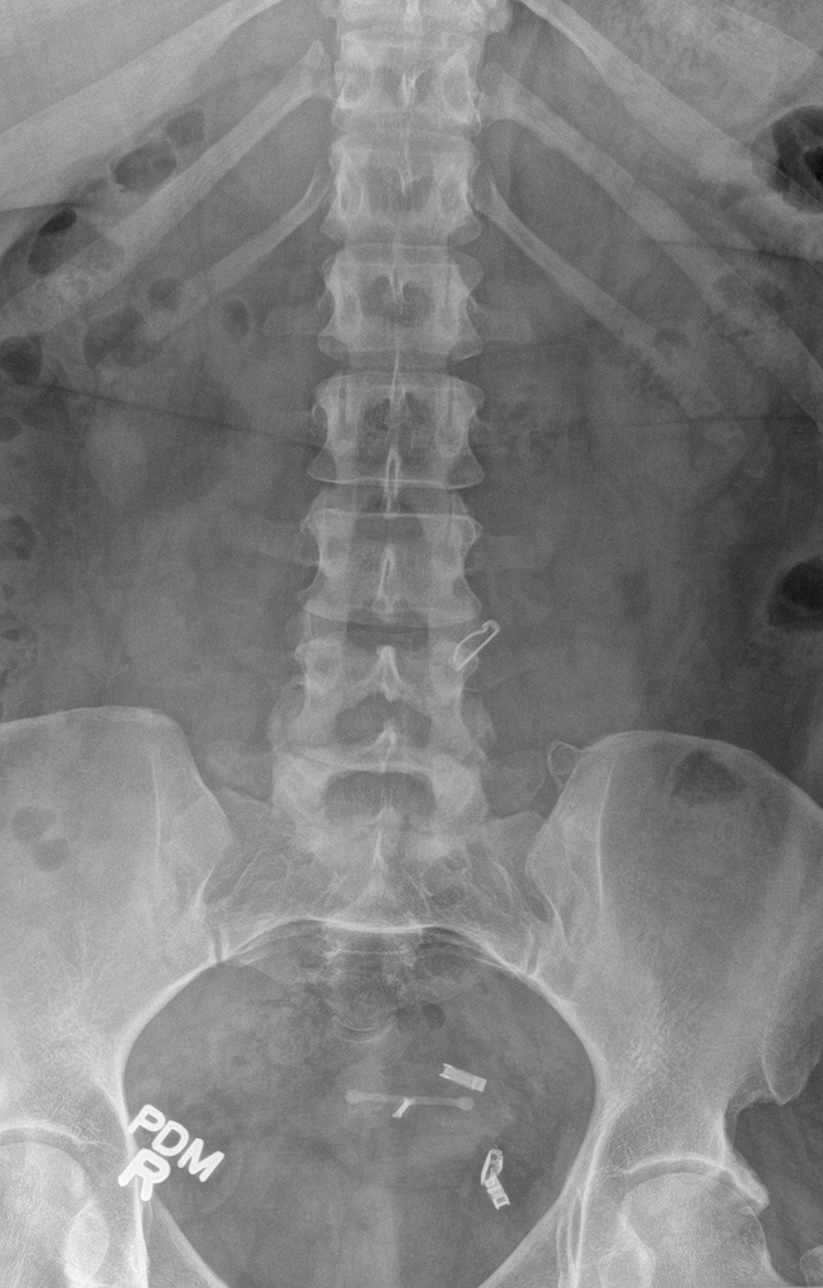

[l-spine obl (1 of 2)]
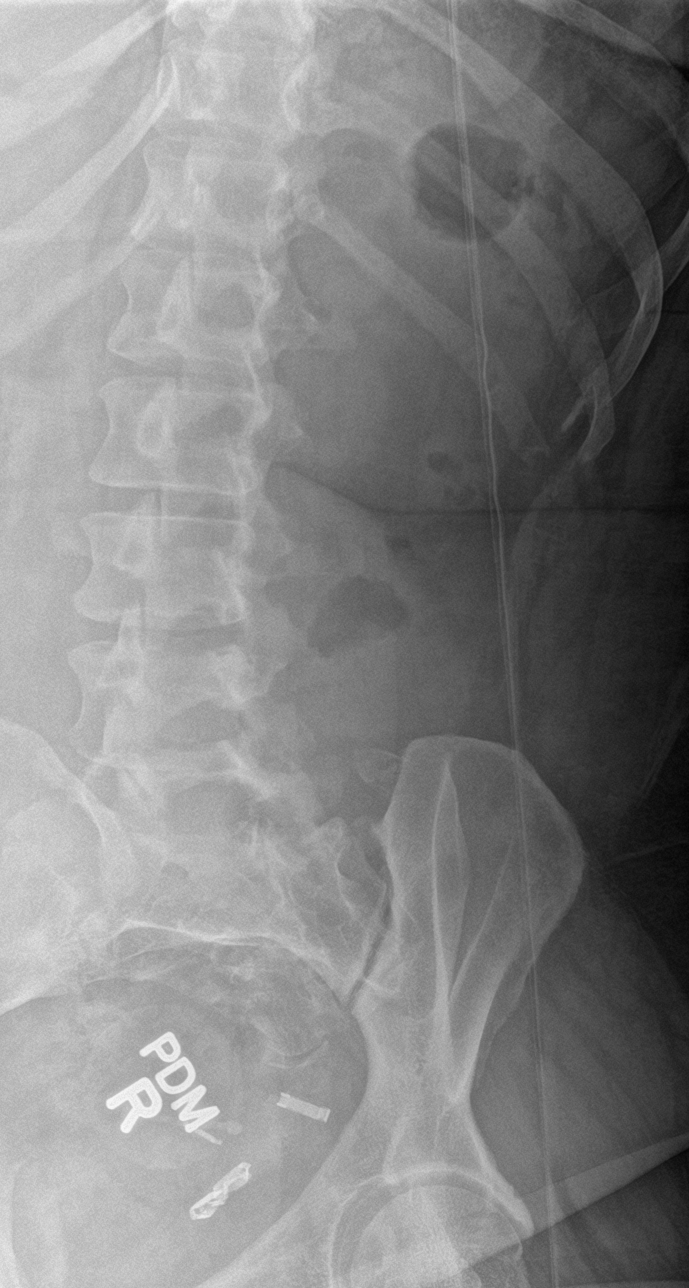

[Series 4: l-spine obl · 0.14mm/px · 2 of 2 slices shown (2 of 2)]
[im 1/2]
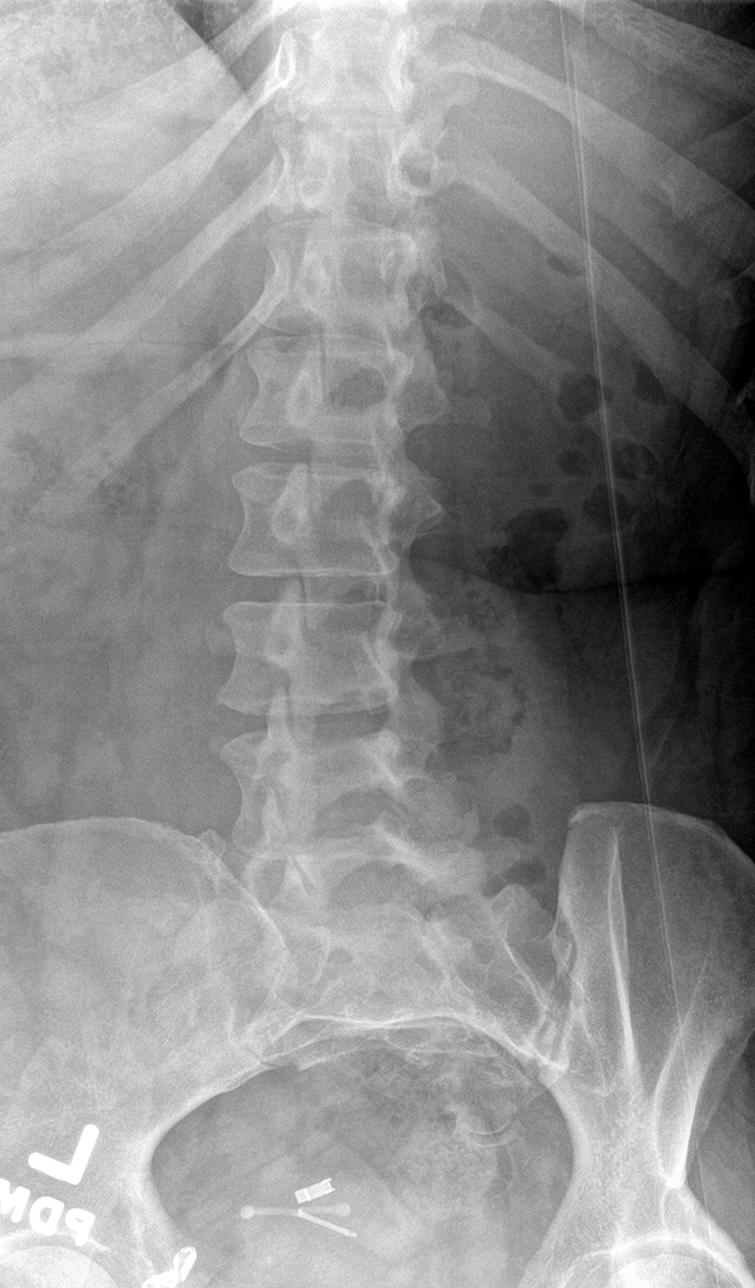
[im 2/2]
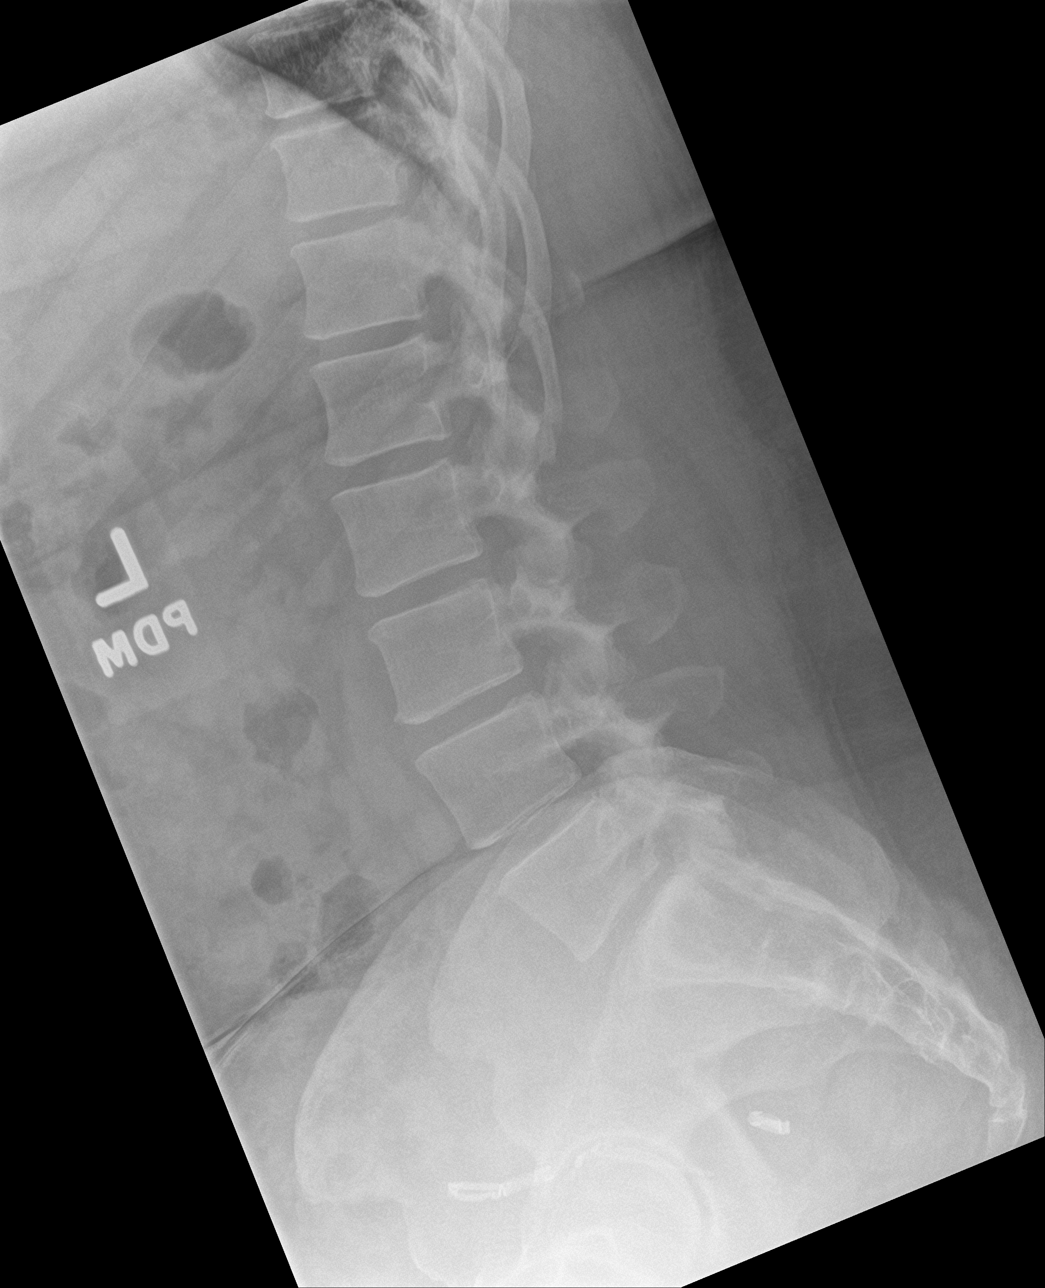

[l-spine spot]
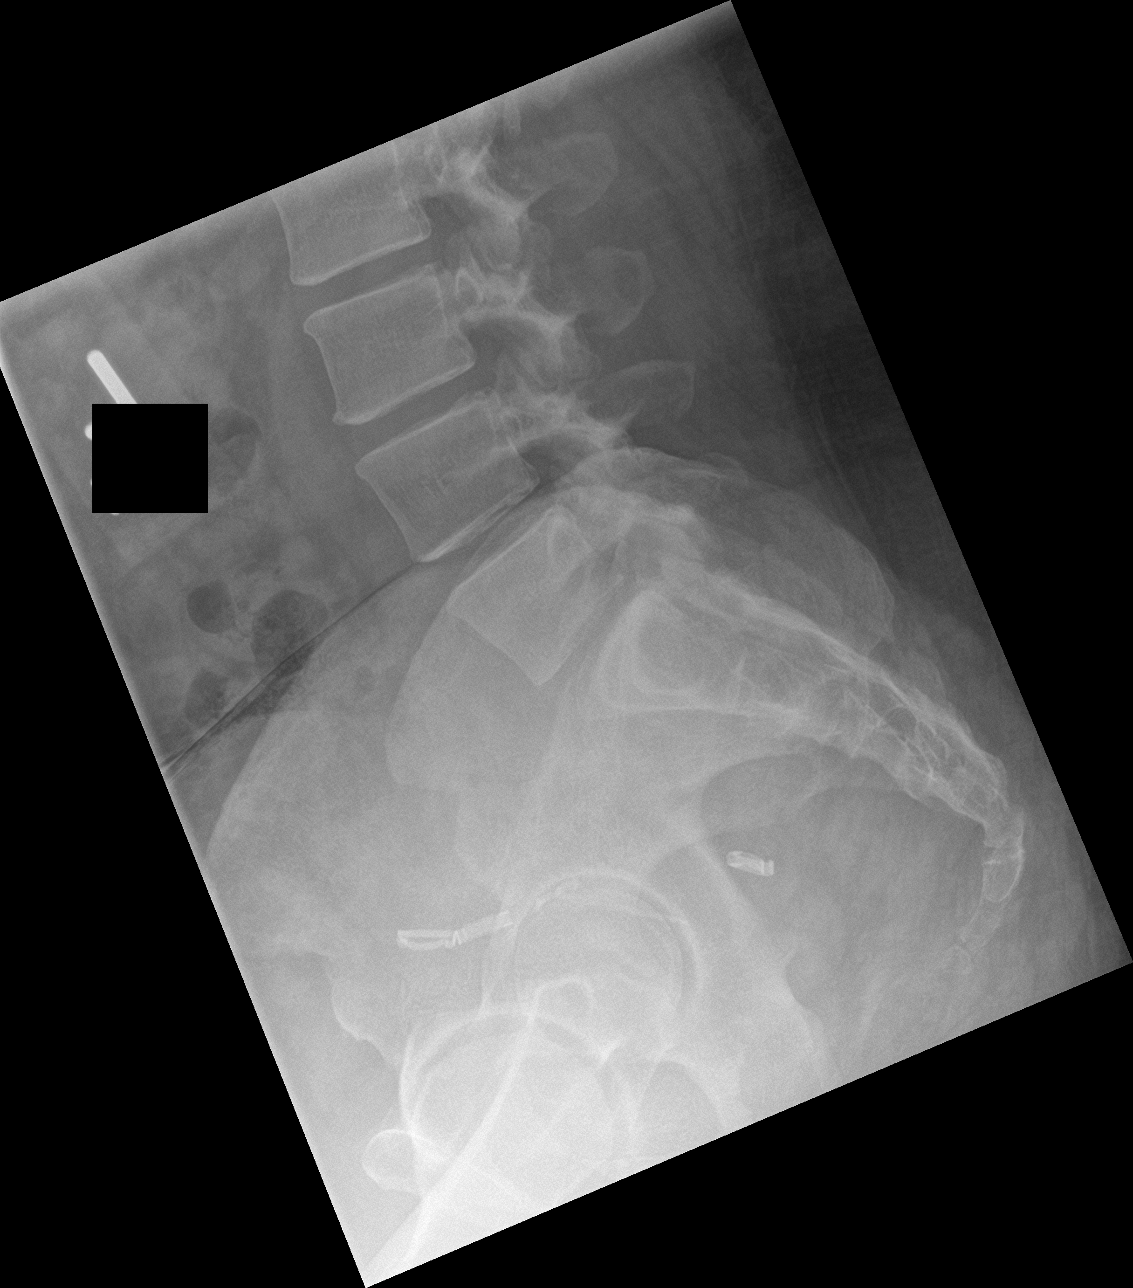

[5 of 5 positions shown; findings below may reference images not displayed]

FINDINGS: Five lumbar type vertebra. There is no acute fracture or subluxation
of the lumbar spine. Vertebral body heights and disc spaces are
maintained. The visualized posterior elements are intact. An
intrauterine device noted. 3 tubal ligation clips over the left
hemipelvis and additional in the left lower abdomen.
IMPRESSION: No acute/traumatic lumbar spine pathology.

## 2022-07-26 DIAGNOSIS — M722 Plantar fascial fibromatosis: Secondary | ICD-10-CM | POA: Diagnosis not present

## 2022-08-26 DIAGNOSIS — Z113 Encounter for screening for infections with a predominantly sexual mode of transmission: Secondary | ICD-10-CM | POA: Diagnosis not present

## 2022-08-26 DIAGNOSIS — B3731 Acute candidiasis of vulva and vagina: Secondary | ICD-10-CM | POA: Diagnosis not present

## 2022-08-26 DIAGNOSIS — N760 Acute vaginitis: Secondary | ICD-10-CM | POA: Diagnosis not present

## 2022-08-26 DIAGNOSIS — Z114 Encounter for screening for human immunodeficiency virus [HIV]: Secondary | ICD-10-CM | POA: Diagnosis not present

## 2022-09-10 DIAGNOSIS — M722 Plantar fascial fibromatosis: Secondary | ICD-10-CM | POA: Diagnosis not present

## 2023-01-03 ENCOUNTER — Ambulatory Visit: Payer: Medicaid Other | Admitting: Dermatology

## 2023-05-13 DIAGNOSIS — H5213 Myopia, bilateral: Secondary | ICD-10-CM | POA: Diagnosis not present

## 2023-06-08 DIAGNOSIS — F3181 Bipolar II disorder: Secondary | ICD-10-CM | POA: Diagnosis not present

## 2023-06-14 DIAGNOSIS — F3181 Bipolar II disorder: Secondary | ICD-10-CM | POA: Diagnosis not present

## 2023-07-01 ENCOUNTER — Ambulatory Visit: Payer: Medicaid Other | Admitting: Family

## 2023-07-01 ENCOUNTER — Encounter: Payer: Self-pay | Admitting: Family

## 2023-07-01 NOTE — Progress Notes (Deleted)
   New Patient Office Visit  Subjective:  Patient ID: Christina Rosales, female    DOB: [birth year]  Age: 43 y.o. MRN: [id number]  CC: No chief complaint on file.   HPI Christina Rosales presents for establishing care today.  Assessment & Plan:  There are no diagnoses linked to this encounter.  Subjective:    Outpatient Medications Prior to Visit  Medication Sig Dispense Refill   albuterol (VENTOLIN HFA) 108 (90 Base) MCG/ACT inhaler Inhale 2 puffs into the lungs every 4 (four) hours as needed for wheezing or shortness of breath. 1 each 2   benzonatate (TESSALON) 100 MG capsule Take 1 capsule (100 mg total) by mouth every 8 (eight) hours. 15 capsule 0   doxycycline (VIBRAMYCIN) 100 MG capsule Take 1 capsule (100 mg total) by mouth 2 (two) times daily. 14 capsule 0   guaiFENesin (MUCINEX) 600 MG 12 hr tablet Take 1 tablet (600 mg total) by mouth 2 (two) times daily. 20 tablet 0   meloxicam (MOBIC) 15 MG tablet Take 1 tablet (15 mg total) by mouth daily. 30 tablet 0   Menthol, Topical Analgesic, (BIOFREEZE EX) Apply 1 application topically as needed (pain).     nicotine (NICODERM CQ - DOSED IN MG/24 HOURS) 21 mg/24hr patch Place 1 patch (21 mg total) onto the skin daily. 28 patch 0   predniSONE (DELTASONE) 20 MG tablet Take 2 tablets (40 mg total) by mouth daily with breakfast. 10 tablet 0   No facility-administered medications prior to visit.   Past Medical History:  Diagnosis Date   Back pain    Past Surgical History:  Procedure Laterality Date   TUBAL LIGATION      Objective:   Today's Vitals: There were no vitals taken for this visit.  Physical Exam Vitals and nursing note reviewed.  Constitutional:      Appearance: Normal appearance.  Cardiovascular:     Rate and Rhythm: Normal rate and regular rhythm.  Pulmonary:     Effort: Pulmonary effort is normal.     Breath sounds: Normal breath sounds.  Musculoskeletal:        General: Normal range of motion.  Skin:     General: Skin is warm and dry.  Neurological:     Mental Status: She is alert.  Psychiatric:        Mood and Affect: Mood normal.        Behavior: Behavior normal.     No orders of the defined types were placed in this encounter.   Dulce Sellar, NP

## 2023-07-14 DIAGNOSIS — F411 Generalized anxiety disorder: Secondary | ICD-10-CM | POA: Diagnosis not present

## 2023-07-14 DIAGNOSIS — F3181 Bipolar II disorder: Secondary | ICD-10-CM | POA: Diagnosis not present

## 2023-08-24 DIAGNOSIS — F411 Generalized anxiety disorder: Secondary | ICD-10-CM | POA: Diagnosis not present

## 2023-08-24 DIAGNOSIS — F3181 Bipolar II disorder: Secondary | ICD-10-CM | POA: Diagnosis not present

## 2023-10-20 DIAGNOSIS — F3181 Bipolar II disorder: Secondary | ICD-10-CM | POA: Diagnosis not present

## 2023-10-20 DIAGNOSIS — F411 Generalized anxiety disorder: Secondary | ICD-10-CM | POA: Diagnosis not present

## 2023-11-21 DIAGNOSIS — F411 Generalized anxiety disorder: Secondary | ICD-10-CM | POA: Diagnosis not present

## 2023-11-22 DIAGNOSIS — F411 Generalized anxiety disorder: Secondary | ICD-10-CM | POA: Diagnosis not present

## 2023-11-22 DIAGNOSIS — F3181 Bipolar II disorder: Secondary | ICD-10-CM | POA: Diagnosis not present

## 2023-12-01 DIAGNOSIS — F3181 Bipolar II disorder: Secondary | ICD-10-CM | POA: Diagnosis not present

## 2023-12-01 DIAGNOSIS — F411 Generalized anxiety disorder: Secondary | ICD-10-CM | POA: Diagnosis not present

## 2023-12-16 DIAGNOSIS — F411 Generalized anxiety disorder: Secondary | ICD-10-CM | POA: Diagnosis not present

## 2023-12-16 DIAGNOSIS — F3181 Bipolar II disorder: Secondary | ICD-10-CM | POA: Diagnosis not present

## 2023-12-19 DIAGNOSIS — F411 Generalized anxiety disorder: Secondary | ICD-10-CM | POA: Diagnosis not present

## 2023-12-19 DIAGNOSIS — F3181 Bipolar II disorder: Secondary | ICD-10-CM | POA: Diagnosis not present

## 2023-12-23 DIAGNOSIS — M722 Plantar fascial fibromatosis: Secondary | ICD-10-CM | POA: Diagnosis not present

## 2023-12-27 ENCOUNTER — Ambulatory Visit: Payer: Self-pay | Admitting: Podiatry

## 2024-01-04 ENCOUNTER — Ambulatory Visit: Payer: Self-pay | Admitting: Podiatry

## 2024-01-11 DIAGNOSIS — Z6841 Body Mass Index (BMI) 40.0 and over, adult: Secondary | ICD-10-CM | POA: Diagnosis not present

## 2024-01-11 DIAGNOSIS — Z1339 Encounter for screening examination for other mental health and behavioral disorders: Secondary | ICD-10-CM | POA: Diagnosis not present

## 2024-01-11 DIAGNOSIS — F209 Schizophrenia, unspecified: Secondary | ICD-10-CM | POA: Diagnosis not present

## 2024-01-11 DIAGNOSIS — G4700 Insomnia, unspecified: Secondary | ICD-10-CM | POA: Diagnosis not present

## 2024-01-11 DIAGNOSIS — F3160 Bipolar disorder, current episode mixed, unspecified: Secondary | ICD-10-CM | POA: Diagnosis not present

## 2024-01-11 DIAGNOSIS — Z1331 Encounter for screening for depression: Secondary | ICD-10-CM | POA: Diagnosis not present

## 2024-01-13 DIAGNOSIS — F3181 Bipolar II disorder: Secondary | ICD-10-CM | POA: Diagnosis not present

## 2024-01-13 DIAGNOSIS — F411 Generalized anxiety disorder: Secondary | ICD-10-CM | POA: Diagnosis not present

## 2024-01-19 DIAGNOSIS — F411 Generalized anxiety disorder: Secondary | ICD-10-CM | POA: Diagnosis not present

## 2024-01-19 DIAGNOSIS — G4700 Insomnia, unspecified: Secondary | ICD-10-CM | POA: Diagnosis not present

## 2024-01-19 DIAGNOSIS — F3181 Bipolar II disorder: Secondary | ICD-10-CM | POA: Diagnosis not present

## 2024-01-20 ENCOUNTER — Ambulatory Visit (HOSPITAL_COMMUNITY): Admission: EM | Admit: 2024-01-20 | Discharge: 2024-01-20 | Disposition: A | Discharge status: Home / Self Care

## 2024-01-23 ENCOUNTER — Ambulatory Visit (HOSPITAL_COMMUNITY)
Admission: EM | Admit: 2024-01-23 | Discharge: 2024-01-23 | Disposition: A | Discharge status: Home / Self Care | Attending: Psychiatry | Admitting: Psychiatry

## 2024-01-23 DIAGNOSIS — Z79899 Other long term (current) drug therapy: Secondary | ICD-10-CM | POA: Insufficient documentation

## 2024-01-23 DIAGNOSIS — F411 Generalized anxiety disorder: Secondary | ICD-10-CM | POA: Insufficient documentation

## 2024-01-23 DIAGNOSIS — F330 Major depressive disorder, recurrent, mild: Secondary | ICD-10-CM | POA: Insufficient documentation
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
## 2024-01-23 NOTE — ED Notes (Signed)
Discharge instructions reviewed. Pt verbalized understanding.  

## 2024-01-23 NOTE — Discharge Instructions (Addendum)
 Please contact your psychiatrist to possibly prescribe hydroxyzine for your anxiety.  Get help right away if: You have thoughts about hurting yourself or others. Get help right away if you feel like you may hurt yourself or others, or have thoughts about taking your own life. Go to your nearest emergency room or: Call 911. Call the National Suicide Prevention Lifeline at 612-353-8545 or 988 in the [place] This is open 24 hours a day. If you're a Veteran: Call 988 and press 1. This is open 24 hours a day. Text the [employer] at 585 319 8274. Summary Mental health is not just the absence of mental illness. It involves understanding your emotions and behaviors, and taking steps to manage them in a healthy way. If you have symptoms of mental or emotional distress, get help from family, friends, a health care provider, or a mental health professional. Practice good mental health behaviors such as stress management skills, self-calming skills, exercise, healthy sleeping and eating, and supportive relationships. This information is not intended to replace advice given to you by your health care provider. Make sure you discuss any questions you have with your health care provider.  Education provided on the fact that if experiencing worsening of psychiatry symptoms including suicidal ideations, homicidal ideations, or having auditory/visual hallucinations, etc, to call 911, 988, come back to this location, or go to the nearest ER. Pt verbalized understanding.

## 2024-01-30 ENCOUNTER — Emergency Department (HOSPITAL_COMMUNITY)

## 2024-01-30 ENCOUNTER — Emergency Department (HOSPITAL_COMMUNITY)
Admission: EM | Admit: 2024-01-30 | Discharge: 2024-01-30 | Disposition: A | Discharge status: Home / Self Care | Attending: Emergency Medicine | Admitting: Emergency Medicine

## 2024-01-30 DIAGNOSIS — R0789 Other chest pain: Secondary | ICD-10-CM | POA: Insufficient documentation

## 2024-01-30 DIAGNOSIS — F419 Anxiety disorder, unspecified: Secondary | ICD-10-CM | POA: Insufficient documentation

## 2024-01-30 DIAGNOSIS — R079 Chest pain, unspecified: Secondary | ICD-10-CM | POA: Diagnosis not present

## 2024-01-30 DIAGNOSIS — F410 Panic disorder [episodic paroxysmal anxiety] without agoraphobia: Secondary | ICD-10-CM | POA: Diagnosis not present

## 2024-01-30 DIAGNOSIS — R0602 Shortness of breath: Secondary | ICD-10-CM | POA: Diagnosis not present

## 2024-01-30 LAB — HCG, SERUM, QUALITATIVE: Preg, Serum: NEGATIVE

## 2024-01-30 LAB — BASIC METABOLIC PANEL WITH GFR
Anion gap: 10 (ref 5–15)
BUN: 8 mg/dL (ref 6–20)
CO2: 22 mmol/L (ref 22–32)
Calcium: 9.3 mg/dL (ref 8.9–10.3)
Chloride: 105 mmol/L (ref 98–111)
Creatinine, Ser: 0.7 mg/dL (ref 0.44–1.00)
GFR, Estimated: >60 mL/min (ref >60)
Glucose, Bld: 114 mg/dL — ABNORMAL HIGH (ref 70–99)
Potassium: 3.8 mmol/L (ref 3.5–5.1)
Sodium: 138 mmol/L (ref 135–145)

## 2024-01-30 LAB — CBC
HCT: 41.3 % (ref 36.0–46.0)
Hemoglobin: 13 g/dL (ref 12.0–15.0)
MCH: 28.7 pg (ref 26.0–34.0)
MCHC: 31.5 g/dL (ref 30.0–36.0)
MCV: 91.2 fL (ref 80.0–100.0)
Platelets: 399 K/uL (ref 150–400)
RBC: 4.53 MIL/uL (ref 3.87–5.11)
RDW: 13.4 % (ref 11.5–15.5)
WBC: 6 K/uL (ref 4.0–10.5)
nRBC: 0 % (ref 0.0–0.2)

## 2024-01-30 LAB — TROPONIN T, HIGH SENSITIVITY
Troponin T High Sensitivity: <15 ng/L (ref 0–19)
Troponin T High Sensitivity: <15 ng/L (ref 0–19)

## 2024-01-30 MED ORDER — HYDROXYZINE HCL 25 MG PO TABS: 12.5000 mg | ORAL_TABLET | Freq: Two times a day (BID) | ORAL | 0 refills | Status: DC | PRN

## 2024-01-30 NOTE — ED Provider Notes (Signed)
 Christina Rosales EMERGENCY DEPARTMENT AT [facility] Provider Note   CSN: [id number] Arrival date & time: 01/30/24  1200     Patient presents with: Anxiety and Chest Pain   Christina Rosales is a 43 y.o. female.  Patient with past history significant for anxiety presents to the emergency department concerns of chest pain.  Reports that she has been getting frequent panic attacks and began to notice right-sided chest pain while driving today.  States that she briefly felt some trouble breathing but this improved on its own.  She currently takes propranolol for anxiety as needed.  She is not on any anxiolytic or benzodiazepine.  Denies any leg swelling, hemoptysis, history of PE or DVT, and is not currently on any anticoagulation.   Anxiety Associated symptoms include chest pain.  Chest Pain Associated symptoms: anxiety        Prior to Admission medications   Medication Sig Start Date End Date Taking? Authorizing Provider  hydrOXYzine (ATARAX) 25 MG tablet Take 0.5 tablets (12.5 mg total) by mouth 2 (two) times daily as needed for anxiety. 01/30/24  Yes Demetruis Depaul A, PA-C  albuterol  (VENTOLIN  HFA) 108 (90 Base) MCG/ACT inhaler Inhale 2 puffs into the lungs every 4 (four) hours as needed for wheezing or shortness of breath. 05/14/22   Horton, Roxie HERO, DO  meloxicam  (MOBIC ) 15 MG tablet Take 1 tablet (15 mg total) by mouth daily. 04/14/21   Leonce Katz, DO  Menthol, Topical Analgesic, (BIOFREEZE EX) Apply 1 application topically as needed (pain).    [provider]  nicotine  (NICODERM CQ  - DOSED IN MG/24 HOURS) 21 mg/24hr patch Place 1 patch (21 mg total) onto the skin daily. 03/15/21   Jadine Toribio SQUIBB, MD    Allergies: Kiwi extract and Percocet [oxycodone-acetaminophen]    Review of Systems  Cardiovascular:  Positive for chest pain.  All other systems reviewed and are negative.   Updated Vital Signs BP 120/81   Pulse 70   Temp 97.7 F (36.5 C) (Oral)    Resp 17   SpO2 100%   Physical Exam Vitals and nursing note reviewed.  Constitutional:      General: She is not in acute distress.    Appearance: She is well-developed.  HENT:     Head: Normocephalic and atraumatic.  Eyes:     Conjunctiva/sclera: Conjunctivae normal.  Cardiovascular:     Rate and Rhythm: Normal rate and regular rhythm.     Heart sounds: No murmur heard. Pulmonary:     Effort: Pulmonary effort is normal. No respiratory distress.     Breath sounds: Normal breath sounds.  Abdominal:     Palpations: Abdomen is soft.     Tenderness: There is no abdominal tenderness.  Musculoskeletal:        General: No swelling.     Cervical back: Neck supple.  Skin:    General: Skin is warm and dry.     Capillary Refill: Capillary refill takes less than 2 seconds.  Neurological:     Mental Status: She is alert.  Psychiatric:        Mood and Affect: Mood normal.     (all labs ordered are listed, but only abnormal results are displayed) Labs Reviewed  BASIC METABOLIC PANEL WITH GFR - Abnormal; Notable for the following components:      Result Value   Glucose, Bld 114 (*)    All other components within normal limits  CBC  HCG, SERUM, QUALITATIVE  TROPONIN T, HIGH  SENSITIVITY  TROPONIN T, HIGH SENSITIVITY    EKG: None  Radiology: DG Chest 2 View Result Date: 01/30/2024 EXAM: 2 VIEW(S) XRAY OF THE CHEST 01/30/2024 12:46:00 PM COMPARISON: None available. CLINICAL HISTORY: CP. Pt states that she gets panic attacks frequently and was having one today while driving, but then began experiencing R sided CP and SOB. Pt states pain still present. FINDINGS: LUNGS AND PLEURA: No focal pulmonary opacity. No pulmonary edema. No pleural effusion. No pneumothorax. HEART AND MEDIASTINUM: No acute abnormality of the cardiac and mediastinal silhouettes. BONES AND SOFT TISSUES: No acute osseous abnormality. IMPRESSION: 1. No acute cardiopulmonary process. Electronically signed by: Waddell Calk MD 01/30/2024 02:23 PM EDT RP Workstation: [id number]     Procedures   Medications Ordered in the ED - No data to display                                  Medical Decision Making Amount and/or Complexity of Data Reviewed Labs: ordered. Radiology: ordered.  Risk Prescription drug management.   This patient presents to the ED for concern of anxiety, chest pain.  Differential diagnosis includes ACS, panic attack, bronchitis, pneumonia    Additional history obtained:  Additional history obtained from epic chart review   Lab Tests:  I Ordered, and personally interpreted labs.  The pertinent results include: CBC unremarkable, BMP unremarkable, hCG negative, troponin negative x 2   Imaging Studies ordered:  I ordered imaging studies including chest x-ray I independently visualized and interpreted imaging which showed no acute cardiopulmonary process I agree with the radiologist interpretation   Problem List / ED Course:  Patient presents the emergency department today with concerns of chest pain and anxiety.  Reports noticing feeling of chest pain while she was driving.  States that she lays in the past and is attributing to panic attacks.  She is currently on propranolol for anxiety but denies any benzodiazepine use.  No reported recent substance use.  Endorses she briefly experienced some trouble breathing but attributed this to anxiety as she states that she is back to normal. Physical exam is unremarkable.  No abnormal heart or lung sounds.  No appreciable lower extremity swelling or edema. Patient is PERC negative. Workup unremarkable.  Troponin negative x 2.  No obvious cardiopulmonary findings to explain symptoms.  Suspect possible anxiety although cannot definitively diagnose this.  With EKG being nonischemic and troponin negative x 2, doubtful of ACS.  With patient being PERC negative, doubt PE.  Advised patient given history of reported anxiety, she would  benefit from more dedicated anxiety medication and propranolol.  Hydroxyzine 12.5 mg twice daily sent to pharmacy.  Advised patient to follow-up closely with PCP to discuss possible daily medication use.  She is otherwise stable for outpatient follow-up and discharged home.   Social Determinants of Health:  None  Final diagnoses:  Other chest pain  Anxiety    ED Discharge Orders          Ordered    hydrOXYzine (ATARAX) 25 MG tablet  2 times daily PRN        01/30/24 1732               Azhane Eckart A, PA-C 01/30/24 1809    Franklyn Sid SAILOR, MD 01/31/24 [phone]

## 2024-01-30 NOTE — ED Notes (Signed)
 Lab failed to alert ED staff that a light green was needed.   Light green tube recollected with 10cc of waste drawn before lab obtained.

## 2024-01-30 NOTE — Discharge Instructions (Signed)
 You were seen in the emergency department today for concerns of chest pain and anxiety.  Your labs and imaging were reassuring there is no obvious signs to suggest that this was a cardiac event that took place.  You did mention that you are not currently on any sort of daily anxiety medication I would recommend discussing this with your primary care provider.  I have started you on a low-dose medication that can use for anxiety called hydroxyzine.  Use this twice daily as needed for anxiety.  Return to the emergency department for any concerns of new or worsening symptoms, otherwise, please follow-up with your primary care provider.
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
## 2024-02-06 DIAGNOSIS — Z6841 Body Mass Index (BMI) 40.0 and over, adult: Secondary | ICD-10-CM | POA: Diagnosis not present

## 2024-02-06 DIAGNOSIS — F209 Schizophrenia, unspecified: Secondary | ICD-10-CM | POA: Diagnosis not present

## 2024-02-06 DIAGNOSIS — G4700 Insomnia, unspecified: Secondary | ICD-10-CM | POA: Diagnosis not present

## 2024-02-06 DIAGNOSIS — F4001 Agoraphobia with panic disorder: Secondary | ICD-10-CM | POA: Diagnosis not present

## 2024-02-06 DIAGNOSIS — F3160 Bipolar disorder, current episode mixed, unspecified: Secondary | ICD-10-CM | POA: Diagnosis not present

## 2024-02-06 DIAGNOSIS — Z79899 Other long term (current) drug therapy: Secondary | ICD-10-CM | POA: Diagnosis not present

## 2024-02-13 ENCOUNTER — Other Ambulatory Visit: Payer: Self-pay

## 2024-02-13 ENCOUNTER — Emergency Department (HOSPITAL_COMMUNITY)

## 2024-02-13 ENCOUNTER — Encounter (HOSPITAL_COMMUNITY): Payer: Self-pay

## 2024-02-13 ENCOUNTER — Emergency Department (HOSPITAL_COMMUNITY): Admission: EM | Admit: 2024-02-13 | Discharge: 2024-02-13 | Disposition: A | Discharge status: Home / Self Care

## 2024-02-13 DIAGNOSIS — J069 Acute upper respiratory infection, unspecified: Secondary | ICD-10-CM | POA: Insufficient documentation

## 2024-02-13 DIAGNOSIS — R059 Cough, unspecified: Secondary | ICD-10-CM | POA: Diagnosis not present

## 2024-02-13 DIAGNOSIS — R109 Unspecified abdominal pain: Secondary | ICD-10-CM | POA: Diagnosis not present

## 2024-02-13 DIAGNOSIS — R10A2 Flank pain, left side: Secondary | ICD-10-CM | POA: Diagnosis not present

## 2024-02-13 LAB — CBC WITH DIFFERENTIAL/PLATELET
Abs Immature Granulocytes: 0 K/uL (ref 0.00–0.07)
Basophils Absolute: 0.1 K/uL (ref 0.0–0.1)
Basophils Relative: 1 %
Eosinophils Absolute: 0.2 K/uL (ref 0.0–0.5)
Eosinophils Relative: 3 %
HCT: 42.4 % (ref 36.0–46.0)
Hemoglobin: 12.9 g/dL (ref 12.0–15.0)
Immature Granulocytes: 0 %
Lymphocytes Relative: 27 %
Lymphs Abs: 1.2 K/uL (ref 0.7–4.0)
MCH: 28.2 pg (ref 26.0–34.0)
MCHC: 30.4 g/dL (ref 30.0–36.0)
MCV: 92.6 fL (ref 80.0–100.0)
Monocytes Absolute: 0.6 K/uL (ref 0.1–1.0)
Monocytes Relative: 14 %
Neutro Abs: 2.5 K/uL (ref 1.7–7.7)
Neutrophils Relative %: 55 %
Platelets: 363 K/uL (ref 150–400)
RBC: 4.58 MIL/uL (ref 3.87–5.11)
RDW: 13.6 % (ref 11.5–15.5)
WBC: 4.5 K/uL (ref 4.0–10.5)
nRBC: 0 % (ref 0.0–0.2)

## 2024-02-13 LAB — URINALYSIS, ROUTINE W REFLEX MICROSCOPIC
Bacteria, UA: NONE SEEN
Bilirubin Urine: NEGATIVE
Glucose, UA: NEGATIVE mg/dL
Ketones, ur: NEGATIVE mg/dL
Leukocytes,Ua: NEGATIVE
Nitrite: NEGATIVE
Protein, ur: NEGATIVE mg/dL
Specific Gravity, Urine: 1.019 (ref 1.005–1.030)
pH: 5 (ref 5.0–8.0)

## 2024-02-13 LAB — COMPREHENSIVE METABOLIC PANEL WITH GFR
ALT: 36 U/L (ref 0–44)
AST: 21 U/L (ref 15–41)
Albumin: 3.9 g/dL (ref 3.5–5.0)
Alkaline Phosphatase: 120 U/L (ref 38–126)
Anion gap: 9 (ref 5–15)
BUN: 10 mg/dL (ref 6–20)
CO2: 23 mmol/L (ref 22–32)
Calcium: 9.1 mg/dL (ref 8.9–10.3)
Chloride: 106 mmol/L (ref 98–111)
Creatinine, Ser: 0.69 mg/dL (ref 0.44–1.00)
GFR, Estimated: >60 mL/min (ref >60)
Glucose, Bld: 83 mg/dL (ref 70–99)
Potassium: 4 mmol/L (ref 3.5–5.1)
Sodium: 138 mmol/L (ref 135–145)
Total Bilirubin: 0.4 mg/dL (ref 0.0–1.2)
Total Protein: 6.9 g/dL (ref 6.5–8.1)

## 2024-02-13 LAB — HCG, SERUM, QUALITATIVE: Preg, Serum: NEGATIVE

## 2024-02-13 LAB — LIPASE, BLOOD: Lipase: 14 U/L (ref 11–51)

## 2024-02-13 MED ORDER — KETOROLAC TROMETHAMINE 15 MG/ML IJ SOLN
15.0000 mg | Freq: Once | INTRAMUSCULAR | Status: AC
  Administered 2024-02-13: 15 mg via INTRAMUSCULAR
  Filled 2024-02-13: qty 1

## 2024-02-13 MED ORDER — KETOROLAC TROMETHAMINE 15 MG/ML IJ SOLN: 15.0000 mg | Freq: Once | INTRAMUSCULAR | Status: DC

## 2024-02-13 MED ORDER — NAPROXEN 500 MG PO TABS: 500.0000 mg | ORAL_TABLET | Freq: Two times a day (BID) | ORAL | 0 refills | Status: AC

## 2024-02-13 MED ORDER — PSEUDOEPH-BROMPHEN-DM 30-2-10 MG/5ML PO SYRP: 5.0000 mL | ORAL_SOLUTION | Freq: Four times a day (QID) | ORAL | 0 refills | Status: AC | PRN

## 2024-02-13 MED ORDER — IPRATROPIUM-ALBUTEROL 0.5-2.5 (3) MG/3ML IN SOLN
3.0000 mL | Freq: Once | RESPIRATORY_TRACT | Status: AC
  Administered 2024-02-13: 3 mL via RESPIRATORY_TRACT
  Filled 2024-02-13: qty 3

## 2024-02-13 NOTE — ED Triage Notes (Signed)
 Pt reports with sharp left flank pain since Saturday along with diarrhea.

## 2024-02-13 NOTE — ED Provider Notes (Signed)
 [place] [facility] AT [facility] Provider Note   CSN: [id number] Arrival date & time: 02/13/24  1107     Patient presents with: Flank Pain   Christina Rosales is a 43 y.o. female.   43 year old female with no reported past medical history presenting to the emergency department today with left-sided flank pain.  The patient states that she has been having some URI symptoms.  Went to urgent care this morning and was told to come to the ER for further evaluation for kidney stones.  The patient states she has been having left flank pain since yesterday.  Reports the pain was initially intermittent.  The pain is now constant since she woke up this morning.  She has had some nausea but denies any associated vomiting.  Reports normal bowel movements.  States that she is having a minimally productive cough with this as well.  She did have COVID and flu testing performed at urgent care which were negative.   Flank Pain       Prior to Admission medications   Medication Sig Start Date End Date Taking? Authorizing Provider  brompheniramine-pseudoephedrine-DM 30-2-10 MG/5ML syrup Take 5 mLs by mouth 4 (four) times daily as needed (Cough). 02/13/24  Yes Ula Prentice SAUNDERS, MD  naproxen  (NAPROSYN ) 500 MG tablet Take 1 tablet (500 mg total) by mouth 2 (two) times daily. 02/13/24  Yes Ula Prentice SAUNDERS, MD  albuterol  (VENTOLIN  HFA) 108 (90 Base) MCG/ACT inhaler Inhale 2 puffs into the lungs every 4 (four) hours as needed for wheezing or shortness of breath. 05/14/22   Horton, Kristie M, DO  Menthol, Topical Analgesic, (BIOFREEZE EX) Apply 1 application topically as needed (pain).    [provider]  nicotine  (NICODERM CQ  - DOSED IN MG/24 HOURS) 21 mg/24hr patch Place 1 patch (21 mg total) onto the skin daily. 03/15/21   Jadine Toribio SQUIBB, MD    Allergies: Kiwi extract and Percocet [oxycodone-acetaminophen]    Review of Systems  Genitourinary:  Positive for flank pain.  All  other systems reviewed and are negative.   Updated Vital Signs BP (!) 142/96 (BP Location: Left Arm)   Pulse 82   Temp 98.5 F (36.9 C) (Oral)   Resp 16   SpO2 99%   Physical Exam Vitals and nursing note reviewed.   Gen: NAD Eyes: PERRL, EOMI HEENT: no oropharyngeal swelling Neck: trachea midline Resp: Faint wheeze noted in the left lower lung field Card: RRR, no murmurs, rubs, or gallops Abd: Left-sided CVA tenderness noted with no overlying rash Extremities: no calf tenderness, no edema Vascular: 2+ radial pulses bilaterally, 2+ DP pulses bilaterally Skin: no rashes Psyc: acting appropriately   (all labs ordered are listed, but only abnormal results are displayed) Labs Reviewed  URINALYSIS, ROUTINE W REFLEX MICROSCOPIC - Abnormal; Notable for the following components:      Result Value   Hgb urine dipstick MODERATE (*)    All other components within normal limits  COMPREHENSIVE METABOLIC PANEL WITH GFR  LIPASE, BLOOD  CBC WITH DIFFERENTIAL/PLATELET  HCG, SERUM, QUALITATIVE    EKG: None  Radiology: CT ABDOMEN PELVIS WO CONTRAST Result Date: 02/13/2024 EXAM: CT ABDOMEN AND PELVIS WITHOUT CONTRAST 02/13/2024 01:14:36 PM TECHNIQUE: CT of the abdomen and pelvis was performed without the administration of intravenous contrast. Multiplanar reformatted images are provided for review. Automated exposure control, iterative reconstruction, and/or weight-based adjustment of the mA/kV was utilized to reduce the radiation dose to as low as reasonably achievable. COMPARISON: None available.  CLINICAL HISTORY: Abdominal/flank pain, stone suspected. FINDINGS: LOWER CHEST: No acute abnormality. LIVER: The liver is unremarkable. GALLBLADDER AND BILE DUCTS: Gallbladder is unremarkable. No biliary ductal dilatation. SPLEEN: No acute abnormality. PANCREAS: No acute abnormality. ADRENAL GLANDS: No acute abnormality. KIDNEYS, URETERS AND BLADDER: No nephrolithiasis. No ureterolithiasis. No  bladder calculi. No hydronephrosis. No perinephric or periureteral stranding. Urinary bladder is unremarkable. GI AND BOWEL: Stomach demonstrates no acute abnormality. Appendix is normal. There is no bowel obstruction. PERITONEUM AND RETROPERITONEUM: No ascites. No free air. VASCULATURE: Aorta is normal in caliber. LYMPH NODES: No lymphadenopathy. REPRODUCTIVE ORGANS: IUD in expected location within the uterus. Tubal ligation clips noted. No adnexal abnormality. BONES AND SOFT TISSUES: No osteophytopathy. No acute osseous abnormality. No focal soft tissue abnormality. IMPRESSION: 1. No nephrolithiasis, ureterolithiasis, or bladder calculi. 2. Normal appendix Electronically signed by: Norleen Boxer MD 02/13/2024 01:55 PM EST RP Workstation: [id number]   DG Chest Portable 1 View Result Date: 02/13/2024 CLINICAL DATA:  Cough. EXAM: PORTABLE CHEST 1 VIEW COMPARISON:  Chest radiograph dated 01/30/2024 FINDINGS: The heart size and mediastinal contours are within normal limits. Both lungs are clear. The visualized skeletal structures are unremarkable. IMPRESSION: No active disease. Electronically Signed   By: Vanetta Chou M.D.   On: 02/13/2024 13:40     Procedures   Medications Ordered in the ED  ketorolac  (TORADOL ) 15 MG/ML injection 15 mg (has no administration in time range)  ipratropium-albuterol  (DUONEB) 0.5-2.5 (3) MG/3ML nebulizer solution 3 mL (3 mLs Nebulization Given 02/13/24 1320)                                    Medical Decision Making 43 year old female with no reported past medical history presents emergency department today with flank pain.  I will further evaluate the patient here with basic labs including LFTs and lipase to evaluate for hepatobiliary pathology or pancreatitis.  Will obtain a CT scan of her abdomen without contrast as she does have some blood in her urine.  There are no other signs of infection on her urinalysis.  Will obtain x-ray as well to eval for left lower lobe  pneumonia.  I will give the patient DuoNeb for the wheezing and reevaluate for ultimate disposition.  The patient's work appears reassuring.  Chest x-ray is clear.  Her CT scan did not show any concerning findings.  She remains well-appearing and hemodynamically stable and is discharged with return precautions.  Amount and/or Complexity of Data Reviewed Labs: ordered. Radiology: ordered.  Risk Prescription drug management.        Final diagnoses:  Upper respiratory tract infection, unspecified type  Left flank pain    ED Discharge Orders          Ordered    naproxen  (NAPROSYN ) 500 MG tablet  2 times daily        02/13/24 1413    brompheniramine-pseudoephedrine-DM 30-2-10 MG/5ML syrup  4 times daily PRN        02/13/24 1413               Ula Prentice SAUNDERS, MD 02/13/24 1414

## 2024-02-13 NOTE — Discharge Instructions (Addendum)
 Your workup today was reassuring.  There were no signs of infection or kidney stones on your workup.  Please take the naproxen  as needed for body aches or headaches/fever.  Take the cough medication as provided.  Please follow-up with your doctor and return to the ER for worsening symptoms.

## 2024-03-08 ENCOUNTER — Ambulatory Visit: Admitting: Physician Assistant

## 2024-03-28 ENCOUNTER — Other Ambulatory Visit: Payer: Self-pay

## 2024-03-28 ENCOUNTER — Encounter (HOSPITAL_COMMUNITY): Payer: Self-pay | Admitting: Emergency Medicine

## 2024-03-28 ENCOUNTER — Emergency Department (HOSPITAL_COMMUNITY): Payer: MEDICAID

## 2024-03-28 ENCOUNTER — Emergency Department (HOSPITAL_COMMUNITY)
Admission: EM | Admit: 2024-03-28 | Discharge: 2024-03-28 | Disposition: A | Discharge status: Home / Self Care | Payer: MEDICAID

## 2024-03-28 DIAGNOSIS — R112 Nausea with vomiting, unspecified: Secondary | ICD-10-CM | POA: Insufficient documentation

## 2024-03-28 DIAGNOSIS — R0789 Other chest pain: Secondary | ICD-10-CM | POA: Diagnosis not present

## 2024-03-28 DIAGNOSIS — R5381 Other malaise: Secondary | ICD-10-CM | POA: Insufficient documentation

## 2024-03-28 DIAGNOSIS — R197 Diarrhea, unspecified: Secondary | ICD-10-CM | POA: Diagnosis not present

## 2024-03-28 DIAGNOSIS — R0602 Shortness of breath: Secondary | ICD-10-CM | POA: Insufficient documentation

## 2024-03-28 DIAGNOSIS — J40 Bronchitis, not specified as acute or chronic: Secondary | ICD-10-CM

## 2024-03-28 DIAGNOSIS — R062 Wheezing: Secondary | ICD-10-CM | POA: Insufficient documentation

## 2024-03-28 DIAGNOSIS — R0981 Nasal congestion: Secondary | ICD-10-CM | POA: Insufficient documentation

## 2024-03-28 LAB — BASIC METABOLIC PANEL WITH GFR
Anion gap: 13 (ref 5–15)
BUN: 8 mg/dL (ref 6–20)
CO2: 22 mmol/L (ref 22–32)
Calcium: 8.5 mg/dL — ABNORMAL LOW (ref 8.9–10.3)
Chloride: 105 mmol/L (ref 98–111)
Creatinine, Ser: 0.68 mg/dL (ref 0.44–1.00)
GFR, Estimated: >60 mL/min
Glucose, Bld: 97 mg/dL (ref 70–99)
Potassium: 3.8 mmol/L (ref 3.5–5.1)
Sodium: 140 mmol/L (ref 135–145)

## 2024-03-28 LAB — CBC
HCT: 40.4 % (ref 36.0–46.0)
Hemoglobin: 13 g/dL (ref 12.0–15.0)
MCH: 29.5 pg (ref 26.0–34.0)
MCHC: 32.2 g/dL (ref 30.0–36.0)
MCV: 91.6 fL (ref 80.0–100.0)
Platelets: 295 K/uL (ref 150–400)
RBC: 4.41 MIL/uL (ref 3.87–5.11)
RDW: 13.4 % (ref 11.5–15.5)
WBC: 3.9 K/uL — ABNORMAL LOW (ref 4.0–10.5)
nRBC: 0 % (ref 0.0–0.2)

## 2024-03-28 LAB — HCG, SERUM, QUALITATIVE: Preg, Serum: NEGATIVE

## 2024-03-28 MED ORDER — PREDNISONE 50 MG PO TABS: 50.0000 mg | ORAL_TABLET | Freq: Every day | ORAL | 0 refills | Status: AC

## 2024-03-28 MED ORDER — ALBUTEROL SULFATE HFA 108 (90 BASE) MCG/ACT IN AERS
2.0000 | INHALATION_SPRAY | Freq: Once | RESPIRATORY_TRACT | Status: AC
  Administered 2024-03-28: 2 via RESPIRATORY_TRACT
  Filled 2024-03-28: qty 6.7

## 2024-03-28 MED ORDER — IPRATROPIUM-ALBUTEROL 0.5-2.5 (3) MG/3ML IN SOLN
3.0000 mL | Freq: Once | RESPIRATORY_TRACT | Status: AC
  Administered 2024-03-28: 3 mL via RESPIRATORY_TRACT
  Filled 2024-03-28: qty 3

## 2024-03-28 NOTE — Discharge Instructions (Signed)
 As discussed take your albuterol  inhaler every 4 hours today and then as needed every 4-6 hours tomorrow.  We are prescribing you steroids to take to further help with your breathing.  Return for fevers, chills, chest pain, worsening shortness of breath, difficulty breathing, lightheadedness, passout or any new or worsening symptoms that are concerning to you.

## 2024-03-28 NOTE — ED Notes (Signed)
 Ambulated patient to restroom, urine sample collected and sent to lab.

## 2024-03-28 NOTE — ED Provider Notes (Signed)
 " [place] EMERGENCY DEPARTMENT AT [facility] Provider Note   CSN: [id number] Arrival date & time: 03/28/24  9376     Patient presents with: Shortness of Breath   Christina Rosales is a 43 y.o. female.   This is a 43 year old female presenting emergency department with shortness of breath.  Started yesterday.  Reports she felt like she had the flu over the weekend generalized malaise, body aches, congestion, nausea, vomiting, and diarrhea.  Started developing some shortness of breath yesterday more dyspnea on exertion as she reports that she does not feel overtly short of breath sitting still.  Not having chest pain.  Denies lung disease or cardiac disease.  Low risk for PE based on Wells criteria.   Shortness of Breath      Prior to Admission medications  Medication Sig Start Date End Date Taking? Authorizing Provider  albuterol  (VENTOLIN  HFA) 108 (90 Base) MCG/ACT inhaler Inhale 2 puffs into the lungs every 4 (four) hours as needed for wheezing or shortness of breath. 05/14/22   Horton, Kristie M, DO  brompheniramine-pseudoephedrine-DM 30-2-10 MG/5ML syrup Take 5 mLs by mouth 4 (four) times daily as needed (Cough). 02/13/24   Ula Prentice SAUNDERS, MD  Menthol, Topical Analgesic, (BIOFREEZE EX) Apply 1 application topically as needed (pain).    [provider]  naproxen  (NAPROSYN ) 500 MG tablet Take 1 tablet (500 mg total) by mouth 2 (two) times daily. 02/13/24   Ula Prentice SAUNDERS, MD  nicotine  (NICODERM CQ  - DOSED IN MG/24 HOURS) 21 mg/24hr patch Place 1 patch (21 mg total) onto the skin daily. 03/15/21   Jadine Toribio SQUIBB, MD    Allergies: Kiwi extract and Percocet [oxycodone-acetaminophen]    Review of Systems  Respiratory:  Positive for shortness of breath.     Updated Vital Signs BP 119/82 (BP Location: Left Arm)   Pulse 73   Temp 98.2 F (36.8 C) (Oral)   Resp 16   SpO2 94%   Physical Exam Vitals and nursing note reviewed.  Constitutional:      General:  She is not in acute distress.    Appearance: She is obese. She is not toxic-appearing.  HENT:     Head: Normocephalic and atraumatic.  Eyes:     Pupils: Pupils are equal, round, and reactive to light.  Cardiovascular:     Rate and Rhythm: Normal rate and regular rhythm.  Pulmonary:     Effort: Pulmonary effort is normal.     Breath sounds: Wheezing present.  Chest:     Chest wall: Tenderness present.  Musculoskeletal:     Cervical back: Normal range of motion.     Right lower leg: No edema.     Left lower leg: No edema.  Skin:    General: Skin is warm and dry.     Capillary Refill: Capillary refill takes less than 2 seconds.  Neurological:     Mental Status: She is alert and oriented to person, place, and time.  Psychiatric:        Mood and Affect: Mood normal.        Behavior: Behavior normal.     (all labs ordered are listed, but only abnormal results are displayed) Labs Reviewed  BASIC METABOLIC PANEL WITH GFR - Abnormal; Notable for the following components:      Result Value   Calcium 8.5 (*)    All other components within normal limits  CBC - Abnormal; Notable for the following components:   WBC 3.9 (*)  All other components within normal limits  HCG, SERUM, QUALITATIVE    EKG: EKG Interpretation Date/Time:  Wednesday March 28 2024 06:42:10 EST Ventricular Rate:  73 PR Interval:  163 QRS Duration:  90 QT Interval:  402 QTC Calculation: 443 R Axis:   45  Text Interpretation: Sinus rhythm Low voltage, precordial leads Borderline T abnormalities, diffuse leads Confirmed by Neysa Clap [phone]) on 03/28/2024 9:09:03 AM  Radiology: DG Chest 2 View Result Date: 03/28/2024 EXAM: 2 VIEW(S) XRAY OF THE CHEST 03/28/2024 06:55:00 AM COMPARISON: Portable chest 02/13/2024. CLINICAL HISTORY: shob shob FINDINGS: LUNGS AND PLEURA: Mild elevation of the right hemidiaphragm, unchanged. No focal pulmonary opacity. No pleural effusion. No pneumothorax. HEART AND  MEDIASTINUM: No acute abnormality of the cardiac and mediastinal silhouettes. BONES AND SOFT TISSUES: No acute osseous abnormality. IMPRESSION: 1. No evidence of acute chest disease. Unchanged. Electronically signed by: Francis Quam MD 03/28/2024 07:24 AM EST RP Workstation: [id number]     Procedures   Medications Ordered in the ED  ipratropium-albuterol  (DUONEB) 0.5-2.5 (3) MG/3ML nebulizer solution 3 mL (3 mLs Nebulization Given 03/28/24 0903)  ipratropium-albuterol  (DUONEB) 0.5-2.5 (3) MG/3ML nebulizer solution 3 mL (3 mLs Nebulization Given 03/28/24 1022)    Clinical Course as of 03/28/24 1043  Wed Mar 28, 2024  0932 Wheezing improved after DuoNeb.  Will give repeat.   [TY]  1042 Wheezing further improved after second breathing treatment.  She notes significant improvement in her shortness of breath while walking.  Was able walk to the bathroom without any distress.  Still maintaining her oxygen saturation on room air.  Will give inhaler and short course of steroids.  Stable for discharge. [TY]    Clinical Course User Index [TY] Neysa Clap PARAS, DO                                 Medical Decision Making This is a 43 year old female with history of back pain and bronchitis presenting to the emergency department for shortness of breath.  Self-reported flulike symptoms over the weekend.  She is afebrile nontachycardic maintaining oxygen saturation on room air.  On exam does not appear to be in respiratory distress.  Does have diffuse wheezing.  Low suspicion for PE, low risk based on Wells criteria.  Chest x-ray today without pneumonia pneumothorax on my independent review.  Radiology also read with no acute abnormalities.  Her EKG appears to be sinus rhythm on my independent interpretation without ST segment changes to indicate ischemia.  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   Subjective preserved EF.  No cardiomegaly or dilated LV or RV.  No D sign or septal flattening.  Given her wheezing I suspect a component of bronchitis/reactive airway.  Given DuoNeb.  Plan to reevaluate.  Amount and/or Complexity of Data Reviewed Labs: ordered. Radiology: ordered.  Risk Prescription drug management.       Final diagnoses:  None    ED Discharge Orders     None          Neysa Clap PARAS, DO 03/28/24 1043  "

## 2024-03-28 NOTE — ED Triage Notes (Signed)
 Pt reports SHOB since yesterday. Reports she has been dx with the flu.

## 2024-05-07 ENCOUNTER — Ambulatory Visit: Payer: MEDICAID | Admitting: Physician Assistant

## 2024-08-28 ENCOUNTER — Ambulatory Visit: Payer: MEDICAID | Admitting: Physician Assistant
